# Patient Record
Sex: Female | Born: 1935 | Race: White | Hispanic: No | Smoking: Never smoker
Health system: Southern US, Community
[De-identification: ages and names within clinical notes are randomized; demographics above are authoritative.]

## PROBLEM LIST (undated history)

## (undated) DIAGNOSIS — L719 Rosacea, unspecified: Secondary | ICD-10-CM

## (undated) DIAGNOSIS — J449 Chronic obstructive pulmonary disease, unspecified: Secondary | ICD-10-CM

## (undated) DIAGNOSIS — K589 Irritable bowel syndrome without diarrhea: Secondary | ICD-10-CM

## (undated) DIAGNOSIS — I10 Essential (primary) hypertension: Secondary | ICD-10-CM

## (undated) DIAGNOSIS — K5790 Diverticulosis of intestine, part unspecified, without perforation or abscess without bleeding: Secondary | ICD-10-CM

## (undated) HISTORY — PX: CHOLECYSTECTOMY: SHX55

## (undated) HISTORY — PX: ADENOIDECTOMY: SUR15

## (undated) HISTORY — PX: ORIF ACETABULAR FRACTURE: SHX5029

## (undated) HISTORY — PX: STAPEDECTOMY: SHX2435

## (undated) HISTORY — DX: Essential (primary) hypertension: I10

## (undated) HISTORY — DX: Irritable bowel syndrome without diarrhea: K58.9

## (undated) HISTORY — PX: APPENDECTOMY: SHX54

## (undated) HISTORY — DX: Rosacea, unspecified: L71.9

## (undated) HISTORY — DX: Diverticulosis of intestine, part unspecified, without perforation or abscess without bleeding: K57.90

## (undated) HISTORY — DX: Chronic obstructive pulmonary disease, unspecified: J44.9

## (undated) HISTORY — PX: TONSILLECTOMY: SUR1361

---

## 1998-01-09 ENCOUNTER — Ambulatory Visit (HOSPITAL_COMMUNITY): Admission: RE | Admit: 1998-01-09 | Discharge: 1998-01-09 | Payer: Self-pay

## 1998-06-06 ENCOUNTER — Encounter: Payer: Self-pay | Admitting: *Deleted

## 1998-06-06 ENCOUNTER — Ambulatory Visit (HOSPITAL_COMMUNITY): Admission: RE | Admit: 1998-06-06 | Discharge: 1998-06-06 | Payer: Self-pay | Admitting: *Deleted

## 1998-07-07 ENCOUNTER — Other Ambulatory Visit: Admission: RE | Admit: 1998-07-07 | Discharge: 1998-07-07 | Payer: Self-pay | Admitting: *Deleted

## 1998-07-28 ENCOUNTER — Ambulatory Visit: Admission: RE | Admit: 1998-07-28 | Discharge: 1998-07-28 | Payer: Self-pay

## 1999-02-01 ENCOUNTER — Ambulatory Visit (HOSPITAL_COMMUNITY): Admission: RE | Admit: 1999-02-01 | Discharge: 1999-02-01 | Payer: Self-pay

## 1999-07-27 ENCOUNTER — Other Ambulatory Visit: Admission: RE | Admit: 1999-07-27 | Discharge: 1999-07-27 | Payer: Self-pay | Admitting: *Deleted

## 2000-01-29 ENCOUNTER — Encounter: Payer: Self-pay | Admitting: Gastroenterology

## 2000-01-29 ENCOUNTER — Ambulatory Visit (HOSPITAL_COMMUNITY): Admission: RE | Admit: 2000-01-29 | Discharge: 2000-01-29 | Payer: Self-pay | Admitting: Gastroenterology

## 2000-02-21 ENCOUNTER — Encounter (INDEPENDENT_AMBULATORY_CARE_PROVIDER_SITE_OTHER): Payer: Self-pay

## 2000-02-21 ENCOUNTER — Other Ambulatory Visit: Admission: RE | Admit: 2000-02-21 | Discharge: 2000-02-21 | Payer: Self-pay | Admitting: *Deleted

## 2000-03-14 ENCOUNTER — Ambulatory Visit (HOSPITAL_COMMUNITY): Admission: RE | Admit: 2000-03-14 | Discharge: 2000-03-14 | Payer: Self-pay | Admitting: Gastroenterology

## 2000-03-14 ENCOUNTER — Encounter: Payer: Self-pay | Admitting: Gastroenterology

## 2000-03-16 ENCOUNTER — Ambulatory Visit (HOSPITAL_COMMUNITY): Admission: RE | Admit: 2000-03-16 | Discharge: 2000-03-16 | Payer: Self-pay

## 2000-05-31 ENCOUNTER — Encounter: Admission: RE | Admit: 2000-05-31 | Discharge: 2000-05-31 | Payer: Self-pay | Admitting: *Deleted

## 2000-05-31 ENCOUNTER — Encounter: Payer: Self-pay | Admitting: *Deleted

## 2001-04-11 ENCOUNTER — Encounter: Admission: RE | Admit: 2001-04-11 | Discharge: 2001-04-11 | Payer: Self-pay | Admitting: *Deleted

## 2001-04-11 ENCOUNTER — Encounter: Payer: Self-pay | Admitting: *Deleted

## 2001-04-11 ENCOUNTER — Other Ambulatory Visit: Admission: RE | Admit: 2001-04-11 | Discharge: 2001-04-11 | Payer: Self-pay | Admitting: *Deleted

## 2002-03-04 ENCOUNTER — Encounter: Payer: Self-pay | Admitting: Rheumatology

## 2002-03-04 ENCOUNTER — Ambulatory Visit (HOSPITAL_COMMUNITY): Admission: RE | Admit: 2002-03-04 | Discharge: 2002-03-04 | Payer: Self-pay | Admitting: Rheumatology

## 2002-05-08 ENCOUNTER — Encounter: Admission: RE | Admit: 2002-05-08 | Discharge: 2002-05-08 | Payer: Self-pay | Admitting: *Deleted

## 2002-05-08 ENCOUNTER — Encounter: Payer: Self-pay | Admitting: *Deleted

## 2003-06-11 ENCOUNTER — Encounter: Admission: RE | Admit: 2003-06-11 | Discharge: 2003-06-11 | Payer: Self-pay | Admitting: *Deleted

## 2004-06-16 ENCOUNTER — Encounter: Admission: RE | Admit: 2004-06-16 | Discharge: 2004-06-16 | Payer: Self-pay | Admitting: *Deleted

## 2005-08-18 ENCOUNTER — Encounter: Admission: RE | Admit: 2005-08-18 | Discharge: 2005-08-18 | Payer: Self-pay | Admitting: *Deleted

## 2006-11-15 ENCOUNTER — Encounter: Admission: RE | Admit: 2006-11-15 | Discharge: 2006-11-15 | Payer: Self-pay | Admitting: *Deleted

## 2006-12-04 ENCOUNTER — Encounter: Admission: RE | Admit: 2006-12-04 | Discharge: 2006-12-04 | Payer: Self-pay | Admitting: *Deleted

## 2007-03-08 ENCOUNTER — Ambulatory Visit: Payer: Self-pay | Admitting: Gastroenterology

## 2007-03-27 ENCOUNTER — Ambulatory Visit: Payer: Self-pay | Admitting: Gastroenterology

## 2007-04-10 ENCOUNTER — Ambulatory Visit: Payer: Self-pay | Admitting: Internal Medicine

## 2007-05-21 ENCOUNTER — Ambulatory Visit: Payer: Self-pay | Admitting: Internal Medicine

## 2007-11-19 ENCOUNTER — Ambulatory Visit: Payer: Self-pay | Admitting: Internal Medicine

## 2007-11-19 DIAGNOSIS — J45909 Unspecified asthma, uncomplicated: Secondary | ICD-10-CM | POA: Insufficient documentation

## 2007-11-25 DIAGNOSIS — J449 Chronic obstructive pulmonary disease, unspecified: Secondary | ICD-10-CM | POA: Insufficient documentation

## 2007-11-25 DIAGNOSIS — I10 Essential (primary) hypertension: Secondary | ICD-10-CM | POA: Insufficient documentation

## 2007-12-13 ENCOUNTER — Encounter: Admission: RE | Admit: 2007-12-13 | Discharge: 2007-12-13 | Payer: Self-pay | Admitting: *Deleted

## 2007-12-21 ENCOUNTER — Encounter: Admission: RE | Admit: 2007-12-21 | Discharge: 2007-12-21 | Payer: Self-pay | Admitting: *Deleted

## 2008-12-19 ENCOUNTER — Ambulatory Visit: Payer: Self-pay | Admitting: Internal Medicine

## 2009-02-06 ENCOUNTER — Encounter: Admission: RE | Admit: 2009-02-06 | Discharge: 2009-02-06 | Payer: Self-pay | Admitting: *Deleted

## 2009-07-20 ENCOUNTER — Ambulatory Visit: Payer: Self-pay | Admitting: Internal Medicine

## 2009-08-31 ENCOUNTER — Telehealth (INDEPENDENT_AMBULATORY_CARE_PROVIDER_SITE_OTHER): Payer: Self-pay | Admitting: *Deleted

## 2010-02-12 ENCOUNTER — Encounter: Admission: RE | Admit: 2010-02-12 | Discharge: 2010-02-12 | Payer: Self-pay | Admitting: *Deleted

## 2010-07-19 ENCOUNTER — Ambulatory Visit: Payer: Self-pay | Admitting: Internal Medicine

## 2010-10-24 ENCOUNTER — Encounter: Payer: Self-pay | Admitting: *Deleted

## 2010-10-25 ENCOUNTER — Encounter: Payer: Self-pay | Admitting: *Deleted

## 2010-11-02 NOTE — Assessment & Plan Note (Signed)
Summary: rov 1 yr ///kp   Primary Tisheena Maguire/Referring Mirakle Tomlin:  Eula Listen  CC:  Yearly follow up visit-COPD/asthma; SOB very little only with Increased excerise.Marland Kitchen  History of Present Illness:  11/19/07- Doing great- good previous 6 months.Winter is usually her worst season, but has not had cold or flu this winter. No recent need for her albuterol rescue inhaler. Walking 2 miles, 3x/week. PFT 8/08 showed mild to moderate obstruction with airtrapping  12/19/08 Asthma, copd Had very good winter. Asked about availability of flu vaccine for here next year. Did not have a significant respiratory illness this winter. Needs meds refilled. Using Advair 100 just once daily now. Fexofenadine 180 has worked well as an antihistamine. Denies productive cough, chest pain or palpitation, nausea or vomiting, bleeding or purulent discharge,  July 20, 2009- Asthma, COPD She asks flu vax. We discussed her hx of minor egg allergy, without previous reaction to flu vaccine. She has done well in recent travelling. Denies weight change, fever, cough, chest pain or palpitation.  July 19, 2010- Asthma, COPD Nurse cc: Yearly follow up visit-COPD/asthma; SOB very little- only with Increased excerise. She is in a regular exercise group for seniors at [facility] and is pleased with her status. No major health events in past year. Wants fluvax. Does have hx test positive for egg allergy but she has tolerated vax twice with no problems the last 2 years. Hasn't needed rescue inhaker but wants it updated. Uses Advair once daily. CXR in 2009 showed CE. She is due for an echocardiogram  2 weeks and she asks about updating CXR. Asks about getting her second pneumonia  vaccine next year.    Asthma History    Initial Asthma Severity Rating:    Age range: 12+ years    Symptoms: 0-2 days/week    Nighttime Awakenings: 0-2/month    Interferes w/ normal activity: no limitations    SABA use (not  for EIB): 0-2 days/week    Asthma Severity Assessment: Intermittent   Preventive Screening-Counseling & Management  Alcohol-Tobacco     Smoking Status: never     Passive Smoke Exposure: yes  Current Medications (verified): 1)  Advair Diskus 100-50 Mcg/dose  Misc (Fluticasone-Salmeterol) .Marland Kitchen.. 1 Puff and Rinse, Twice Daily 2)  Fluticasone Propionate 50 Mcg/act Susp (Fluticasone Propionate) .Marland Kitchen.. 1-2 Sprays Each Nostril Daily 3)  Proair Hfa 108 (90 Base) Mcg/act Aers (Albuterol Sulfate) .... 2 Puffs Four Times A Day As Needed Rescue Inhaler 4)  Minocin 50 Mg  Caps (Minocycline Hcl) .... Take One Capsule By Mouth Two Times A Day As Needed 5)  Lisinopril 10 Mg  Tabs (Lisinopril) .... Take 1 Tablet By Mouth Once A Day 6)  Norvasc 10 Mg  Tabs (Amlodipine Besylate) .... Take 1 Tablet By Mouth Once A Day 7)  Metoprolol Succinate 50 Mg  Tb24 (Metoprolol Succinate) .... Take 1 Tablet By Mouth Once A Day 8)  Actonel 150 Mg Tabs (Risedronate Sodium) .... Take 1 Tablet By Mouth Once A Day 9)  Aspirin Adult Low Strength 81 Mg Tbec (Aspirin) .... Take 1 Tablet By Mouth Once A Day  Allergies (verified): 1)  ! Penicillin 2)  ! * Flu Vaccination 3)  ! * Eggs  Past History:  Past Medical History: Last updated: 11/19/2007 Asthma C O P D diverticulosis irritable bowel Hypertension rosacea  Past Surgical History: Last updated: 11/19/2007 ORIF right leg fx stapedectomy Appendectomy Cholecystectomy  Family History: Last updated: 11/19/2007 Allergy asthma arthritis cancer  Social History: Last updated:  11/19/2007 Patient never smoked.  Positive history of passive tobacco smoke exposure. widowed twice   Risk Factors: Smoking Status: never (07/19/2010) Passive Smoke Exposure: yes (07/19/2010)  Review of Systems      See HPI  The patient denies shortness of breath with activity, shortness of breath at rest, productive cough, non-productive cough, coughing up blood, chest pain,  irregular heartbeats, acid heartburn, indigestion, loss of appetite, weight change, abdominal pain, difficulty swallowing, sore throat, tooth/dental problems, headaches, nasal congestion/difficulty breathing through nose, sneezing, ear ache, and rash.    Vital Signs:  Patient profile:   77 year old female Height:      65 inches Weight:      129.25 pounds BMI:     21.59 O2 Sat:      99 % on Room air Pulse rate:   71 / minute BP sitting:   114 / 70  (left arm) Cuff size:   regular  Vitals Entered By: Reynaldo Minium CMA (July 19, 2010 1:57 PM)  O2 Flow:  Room air CC: Yearly follow up visit-COPD/asthma; SOB very little only with Increased excerise.   Physical Exam  Additional Exam:  General: A/Ox3; pleasant and cooperative, NAD, slender, alert elderly lady, very pleasant SKIN: no rash, lesions NODES: no lymphadenopathy HEENT: [place]/AT, EOM- WNL, Conjuctivae- clear, PERRLA, TM-WNL, Nose- clear, Throat- clear and wnl, Mallampati II, own teeth NECK: Supple w/ fair ROM, JVD- mild, normal carotid impulses w/o bruits Thyroid- normal to palpation CHEST: Clear to P&A- no wheeze or rhonchi, unlabored HEART: RRR, no m/g/r heard ABDOMEN: medium build ZOX:WRUE, nl pulses, no edema , good capillary refill NEURO: Grossly intact to observation      Impression & Recommendations:  Problem # 1:  ASTHMA (ICD-493.90) Minimal reactive airway symptoms. We are refiling meds including her rescue inhaler that she wants to keep available.   Problem # 2:  C O P D (ICD-496) We will update CXR. Her continued exercise is a great asset.Flu vaccine discussion and genral infection avoidance strategies.  Other Orders: Est. Patient Level III (45409) T-2 View CXR (71020TC) Influenza Vaccine MCR (81191)  Patient Instructions: 1)  Please schedule a follow-up appointment in 1 year. 2)  I am really glad you are doing so well. Please call if you need me.  3)  Meds refilled 4)  A chest x-ray has been  recommended.  Your imaging study may require preauthorization.  Prescriptions: PROAIR HFA 108 (90 BASE) MCG/ACT AERS (ALBUTEROL SULFATE) 2 puffs four times a day as needed rescue inhaler  #1 x prn   Entered and Authorized by:   Waymon Budge MD   Signed by:   Waymon Budge MD on 07/19/2010   Method used:   Print then Give to Patient   RxID:   [id number] FLUTICASONE PROPIONATE 50 MCG/ACT SUSP (FLUTICASONE PROPIONATE) 1-2 sprays each nostril daily  #3 x 3   Entered and Authorized by:   Waymon Budge MD   Signed by:   Waymon Budge MD on 07/19/2010   Method used:   Print then Give to Patient   RxID:   [id number] ADVAIR DISKUS 100-50 MCG/DOSE  MISC (FLUTICASONE-SALMETEROL) 1 puff and rinse, twice daily  #3 x 3   Entered and Authorized by:   Waymon Budge MD   Signed by:   Waymon Budge MD on 07/19/2010   Method used:   Print then Give to Patient   RxID:   [id number]    Immunizations Administered:  Influenza  Vaccine # 1:    Vaccine Type: Fluvax MCR    Site: left deltoid    Mfr: GlaxoSmithKline    Dose: 0.5 ml    Route: IM    Given by: Kandice Hams CMA    Exp. Date: 04/02/2011    Lot #: [id number]    VIS given: 04/27/10 version given July 19, 2010.  Flu Vaccine Consent Questions:    Do you have a history of severe allergic reactions to this vaccine? no    Any prior history of allergic reactions to egg and/or gelatin? no    Do you have a sensitivity to the preservative Thimersol? no    Do you have a past history of Guillan-Barre Syndrome? no    Do you currently have an acute febrile illness? no    Have you ever had a severe reaction to latex? no    Vaccine information given and explained to patient? yes    Are you currently pregnant? no

## 2011-02-15 NOTE — Assessment & Plan Note (Signed)
[facility]                             PULMONARY OFFICE NOTE   [name], [name]                 MRN:          [id number]  DATE:05/21/2007                            DOB:          10-29-1928    PROBLEM:  Obstructive airways disease with bronchitis.   HISTORY:  Fairly quiet Summer, complains she can not fight off colds.  Have noticed that egg white makes her mouth itch, lips swell and also  causes a contact dermatitis so she avoids flu vaccine.  She is walking  about 2 miles 3 times a week on level ground and expects trouble in  cold and flu season as a routine.  She has had pneumococcal vaccine once  a number of years ago.   MEDICATIONS:  1. Astelin.  2. Fexofenadine 180 mg.  3. Minocin 50 mg b.i.d.  4. Advair 250/50.  5. Lisinopril 10 mg.  6. Norvasc 10 mg.  7. Metoprolol 50 mg.  8. She has an albuterol inhaler.   DRUG INTOLERANT PENICILLIN, EGG.   OBJECTIVE:  Weight 128 pounds, blood pressure 146/84, pulse 73, room air  saturation 98%.  Currently she looks quite comfortable and stable.  EYES, NOSE AND THROAT:  Clear.  CHEST:  Clear, breathing unlabored.  HEART:  Sounds regular without murmur.  There is no edema.   Chest x-ray report from [facility] on November 21, 2006  describes cardiomegaly and COPD, stable with no acute process compared  with March 23, 2006.  Our pulmonary function testing from May 21, 2007  showed mild obstructive airways disease with response to bronchodilator.  There was evidence of air trapping with increased residual volume,  diffusion capacity was mildly reduced.  Her FEV1 after bronchodilator  was 1.63 liters (82% predicted) with FEV1/FVC 0.72.  Significant  response to bronchodilator was limited to small airway flows which  remained reduced.  Diffusion was 66% of predicted.  On a 6 minute walk  test she went 441 meters in 6 minutes, completing the test without  stopping.  Heart  rate rose appropriately from 73 to 86 and after 2  minutes rest was 70.  Oxygen saturation remained stable through the test  at 98% - 99% and two minutes later was 99% indicating normal  cardiopulmonary response to this exercise.   IMPRESSION:  1. Cough with asthma and a relatively mild chronic obstructive      component.  Triggers would seem to include nonspecific irritants      including second hand smoke as well as some seasonal allergens and      especially viral triggers.  2. ALLERGIC TO EGG WHITE.   PLAN:  1. Try sample Spiriva.  2. She may want to try nasal flu vaccine or depend on Tamiflu this      fall.  3. Schedule return 6 months, earlier p.r.n.     Clinton D. Maple Hudson, MD, Tonny Bollman, FACP  Electronically Signed    CDY/MedQ  DD: 06/04/2007  DT: 06/04/2007  Job #: [id number]   cc:   Kirt Boys, MD

## 2011-02-15 NOTE — Assessment & Plan Note (Signed)
[facility]                             PULMONARY OFFICE NOTE   MUSETTE, [name]                 MRN:          [id number]  DATE:04/10/2007                            DOB:          May 28, 1929    PROBLEM LIST:  A 77 year old woman referred through the courtesy of Dr.  Montez Morita in [place] because of COPD.   HISTORY:  She says asthma was bad as a child when she had recurrent  bronchitis and pneumonia.  But, she has done much better as an adult.  In January 2008, she visited [place] and [place], where she was exposed to  a lot of tobacco smoke and boat exhaust.  She became congested with low-  grade fever.  On return she was treated with antibiotics, cough meds,  and a chest x-ray showed COPD.  This is the first time she had been told  the diagnosis.  She was given an injected antibiotic and a sample  metered inhaler.  She has begun using Advair and has rarely used  albuterol.  Now not much cough except around cigarette smoke, car  exhaust, and days when air quality is reported poor.   MEDICATIONS:  1. Astelin.  2. Fexofenadine 180 mg.  3. Minocin 50 mg b.i.d.  4. Advair 250/50.  5. Lisinopril 10 mg.  6. Norvasc 10 mg.  7. Metoprolol 50 mg.  8. Albuterol rescue inhaler.   DRUG INTOLERANT:  PENICILLIN.   REVIEW OF SYSTEMS:  Short of breath with activity, productive and  nonproductive cough, chest pains, nasal congestion, and sneezing.  She  denies any reflux or indigestion symptoms.  Grandchildren tell her she  snores.  She sleeps on 1 pillow.  Breathing does not wake her at night.  She walks 3 times a week for a mile and a half or 2 miles.  No ankle  edema.  No routine phlegm.  No blood, adenopathy, or fever.   PAST HISTORY:  1. Hypertension.  2. Asthma.  3. Allergic rhinitis with seasonal exacerbations and frequent head      congestion.  4. Previous pneumonia.  Has had pneumococcal vaccine once.  She avoids      flu vaccine because  of a history of egg allergy.  5. Chest pain workup was negative a year ago.  No exposure to      tuberculosis.  6. Treated for rosacea.   SURGERY:  1. Gallbladder.  2. Appendix.  3. Stapedectomy.  4. She has had ORIF of a right leg fracture.   SOCIAL HISTORY:  Never smoked.  She has been married twice and widowed.  Grown children.  Lives alone.  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   FAMILY HISTORY:  Allergies, asthma, rheumatism, cancer.   OBJECTIVE:  VITAL SIGNS:  Weight  131 pounds, BP 130/84, pulse 71, room  air saturation 99%.  GENERAL:  This is a trim, alert woman in no evident distress, rather  talkative.  HEENT:  Nose and throat are clear.  No neck vein distention or stridor.  I do not find adenopathy.  CHEST:  Quiet without  rales, rhonchi, or wheeze.  She does not cough  while here.  HEART:  Sounds are regular without murmur.  EXTREMITIES:  Without cyanosis, clubbing, or edema.   Chest x-ray:  She brings a film from November 21, 2006.  There is some  peribronchial cuffing at the right hilum which would be consistent with  bronchitis.   IMPRESSION:  Resolving bronchitis, uncertain component of fixed  obstructive airway disease after a prior history of asthma.   PLAN:  1. We are scheduling pulmonary function tests.  2. She will continue Advair 250/50.  3. Consider a followup pneumococcal vaccine since it has been probably      more than 5 years.  4. Schedule return in 1 month.  I appreciate the chance to meet her      and would be happy to discuss her care.     Clinton D. Maple Hudson, MD, Tonny Bollman, FACP  Electronically Signed    CDY/MedQ  DD: 04/28/2007  DT: 04/29/2007  Job #: [id number]   cc:   Kirt Boys, Dr.

## 2011-02-22 ENCOUNTER — Other Ambulatory Visit: Payer: Self-pay | Admitting: *Deleted

## 2011-02-22 DIAGNOSIS — Z1231 Encounter for screening mammogram for malignant neoplasm of breast: Secondary | ICD-10-CM

## 2011-03-04 ENCOUNTER — Ambulatory Visit
Admission: RE | Admit: 2011-03-04 | Discharge: 2011-03-04 | Disposition: A | Discharge status: Home / Self Care | Payer: Medicare Other | Source: Ambulatory Visit | Attending: *Deleted | Admitting: *Deleted

## 2011-03-04 ENCOUNTER — Ambulatory Visit: Payer: Self-pay

## 2011-03-04 DIAGNOSIS — Z1231 Encounter for screening mammogram for malignant neoplasm of breast: Secondary | ICD-10-CM

## 2011-03-07 ENCOUNTER — Other Ambulatory Visit: Payer: Self-pay | Admitting: *Deleted

## 2011-03-07 DIAGNOSIS — Z1231 Encounter for screening mammogram for malignant neoplasm of breast: Secondary | ICD-10-CM

## 2011-04-25 ENCOUNTER — Telehealth: Payer: Self-pay | Admitting: Internal Medicine

## 2011-04-25 MED ORDER — CEFDINIR 300 MG PO CAPS: 300.0000 mg | ORAL_CAPSULE | Freq: Two times a day (BID) | ORAL | Status: AC

## 2011-04-25 MED ORDER — PREDNISONE 10 MG PO TABS: ORAL_TABLET | ORAL | Status: DC

## 2011-04-25 NOTE — Telephone Encounter (Signed)
Offer cefdinir 300 mg, # 14, 2 daily, no refill           Prednisone 10 mg, # 20, 4 X 2 DAYS, 3 X 2 DAYS, 2 X 2 DAYS, 1 X 2 DAYS

## 2011-04-25 NOTE — Telephone Encounter (Signed)
Spoke with pt and notified of recs per CDY. Pt verbalized understanding. Rxs were sent to [facility]. I advised that she contact us if symptoms persist or worsen, or seek emergency care if needed. Pt verbalized understanding.

## 2011-04-25 NOTE — Telephone Encounter (Signed)
I spoke with the pt and she is c/o chest congestion, productive cough with yellow phlegm, head congestion, increased SOB all x 2 weeks. She denies chest tightness, fever or wheezing. She states she has been taking benadryl and robitussin without relief. Please advise. Carron Curie, CMA Allergies  Allergen Reactions  . Eggs Or Egg-Derived Products   . Penicillins    [facility] [place]

## 2011-07-18 ENCOUNTER — Encounter: Payer: Self-pay | Admitting: Internal Medicine

## 2011-07-18 ENCOUNTER — Ambulatory Visit (INDEPENDENT_AMBULATORY_CARE_PROVIDER_SITE_OTHER): Payer: Medicare Other | Admitting: Internal Medicine

## 2011-07-18 VITALS — BP 120/82 | HR 67 | Ht 65.0 in | Wt 128.4 lb

## 2011-07-18 DIAGNOSIS — J310 Chronic rhinitis: Secondary | ICD-10-CM

## 2011-07-18 DIAGNOSIS — J449 Chronic obstructive pulmonary disease, unspecified: Secondary | ICD-10-CM

## 2011-07-18 DIAGNOSIS — Z23 Encounter for immunization: Secondary | ICD-10-CM

## 2011-07-18 MED ORDER — FLUTICASONE PROPIONATE 50 MCG/ACT NA SUSP: 2.0000 | Freq: Every day | NASAL | Status: DC

## 2011-07-18 MED ORDER — CEFDINIR 300 MG PO CAPS: 300.0000 mg | ORAL_CAPSULE | Freq: Two times a day (BID) | ORAL | Status: AC

## 2011-07-18 NOTE — Progress Notes (Signed)
07/18/11- 77 year old female never smoker followed for asthma/COPD, rhinitis, complicated by HBP Last here- 07/19/2010 6 weeks ago she saw her primary physician in [place] bronchitis. Treated with a Z-Pak which did not help. We called in prednisone and Cefdinir, which did much better. She was clear in 2 or 3 days and feels well now. She had questions about whooping cough vaccine and wants a flu shot. She had had recent tetanus vaccine. She is trying to exercise at her local hospital rehabilitation program.  She says she is not coughing routinely.  ROS See HPI Constitutional:   No-   weight loss, night sweats, fevers, chills, fatigue, lassitude. HEENT:   No-  headaches, difficulty swallowing, tooth/dental problems, sore throat,       No-  sneezing, itching, ear ache, nasal congestion, post nasal drip,  CV:  No-   chest pain, orthopnea, PND, swelling in lower extremities, anasarca, dizziness, palpitations Resp: Occasional shortness of breath with exertion or at rest.              No-   productive cough,  No non-productive cough,  No- coughing up of blood.              No-   change in color of mucus.  No- wheezing.   Skin: No-   rash or lesions. GI:  No-   heartburn, indigestion, abdominal pain, nausea, vomiting, diarrhea,                 change in bowel habits, loss of appetite GU: No-   dysuria, change in color of urine, no urgency or frequency.  No- flank pain. MS:  No-   joint pain or swelling.  No- decreased range of motion.  No- back pain. Neuro-     nothing unusual Psych:  No- change in mood or affect. No depression or anxiety.  No memory loss.  OBJ General- Alert, Oriented, Affect-appropriate, Distress- none acute; talkative Skin- rash-none, lesions- none, excoriation- none Lymphadenopathy- none Head- atraumatic            Eyes- Gross vision intact, PERRLA, conjunctivae clear secretions            Ears- Hearing, canals-normal            Nose- Clear, no-Septal dev, mucus, polyps,  erosion, perforation             Throat- Mallampati II , mucosa clear , drainage- none, tonsils- atrophic Neck- flexible , trachea midline, no stridor , thyroid nl, carotid no bruit Chest - symmetrical excursion , unlabored           Heart/CV- RRR , no murmur , no gallop  , no rub, nl s1 s2                           - JVD- none , edema- none, stasis changes- none, varices- none           Lung- clear to P&A, wheeze- none, cough- none , dullness-none, rub- none           Chest wall-  Abd- tender-no, distended-no, bowel sounds-present, HSM- no Br/ Gen/ Rectal- Not done, not indicated Extrem- cyanosis- none, clubbing, none, atrophy- none, strength- nl Neuro- grossly intact to observation

## 2011-07-18 NOTE — Patient Instructions (Signed)
Flu vax  Script for flonase nasal spray  Script for antibiotic to carry on your trip

## 2011-07-20 NOTE — Assessment & Plan Note (Signed)
I've encouraged endurance training the most effective way to maintain her exercise tolerance for activities of daily living. Flu vaccine

## 2012-02-01 ENCOUNTER — Other Ambulatory Visit: Payer: Self-pay | Admitting: Internal Medicine

## 2012-04-16 ENCOUNTER — Other Ambulatory Visit: Payer: Self-pay | Admitting: *Deleted

## 2012-04-16 DIAGNOSIS — Z1231 Encounter for screening mammogram for malignant neoplasm of breast: Secondary | ICD-10-CM

## 2012-04-18 ENCOUNTER — Ambulatory Visit
Admission: RE | Admit: 2012-04-18 | Discharge: 2012-04-18 | Disposition: A | Discharge status: Home / Self Care | Payer: Medicare Other | Source: Ambulatory Visit | Attending: *Deleted | Admitting: *Deleted

## 2012-04-18 DIAGNOSIS — Z1231 Encounter for screening mammogram for malignant neoplasm of breast: Secondary | ICD-10-CM

## 2012-07-17 ENCOUNTER — Encounter: Payer: Self-pay | Admitting: Internal Medicine

## 2012-07-17 ENCOUNTER — Ambulatory Visit (INDEPENDENT_AMBULATORY_CARE_PROVIDER_SITE_OTHER): Payer: Medicare Other | Admitting: Internal Medicine

## 2012-07-17 VITALS — BP 120/74 | HR 77 | Ht 65.0 in | Wt 128.6 lb

## 2012-07-17 DIAGNOSIS — J302 Other seasonal allergic rhinitis: Secondary | ICD-10-CM

## 2012-07-17 DIAGNOSIS — Z23 Encounter for immunization: Secondary | ICD-10-CM

## 2012-07-17 DIAGNOSIS — J449 Chronic obstructive pulmonary disease, unspecified: Secondary | ICD-10-CM

## 2012-07-17 DIAGNOSIS — J309 Allergic rhinitis, unspecified: Secondary | ICD-10-CM

## 2012-07-17 NOTE — Progress Notes (Signed)
07/18/11- 78 year old female never smoker followed for asthma/COPD, rhinitis, complicated by HBP Last here- 07/19/2010 6 weeks ago she saw her primary physician in [place] bronchitis. Treated with a Z-Pak which did not help. We called in prednisone and Cefdinir, which did much better. She was clear in 2 or 3 days and feels well now. She had questions about whooping cough vaccine and wants a flu shot. She had had recent tetanus vaccine. She is trying to exercise at her local hospital rehabilitation program.  She says she is not coughing routinely.  07/17/12- 78 year old female never smoker followed for asthma/COPD, rhinitis, complicated by HBP Watery rhinorrhea bothers her seasonally. Uses Benadryl but it makes her sleepy. Using Flonase. Minimal cough. COPD assessment test (CAT) 9/40 a1AT MZ- I got into a lengthy explanation with her about genetics and the medical significance. As a heterozygote she is not at increased risk under routine circumstances.  ROS-See HPI Constitutional:   No-   weight loss, night sweats, fevers, chills, fatigue, lassitude. HEENT:   No-  headaches, difficulty swallowing, tooth/dental problems, sore throat,       No-  sneezing, itching, ear ache, nasal congestion, post nasal drip,  CV:  No-   chest pain, orthopnea, PND, swelling in lower extremities, anasarca, dizziness, palpitations Resp: Occasional shortness of breath with exertion or at rest.              No-   productive cough,  No non-productive cough,  No- coughing up of blood.              No-   change in color of mucus.  No- wheezing.   Skin: No-   rash or lesions. GI:  No-   heartburn, indigestion, abdominal pain, nausea, vomiting,  GU: . MS:  No-   joint pain or swelling.   Neuro-     nothing unusual Psych:  No- change in mood or affect. No depression or anxiety.  No memory loss.  OBJ General- Alert, Oriented, Affect-appropriate, Distress- none acute; talkative Skin- rash-none, lesions- none,  excoriation- none Lymphadenopathy- none Head- atraumatic            Eyes- Gross vision intact, PERRLA, conjunctivae clear secretions            Ears- Hearing, canals-normal            Nose- Clear, no-Septal dev, mucus, polyps, erosion, perforation             Throat- Mallampati II , mucosa clear , drainage- none, tonsils- atrophic Neck- flexible , trachea midline, no stridor , thyroid nl, carotid no bruit Chest - symmetrical excursion , unlabored           Heart/CV- RRR , no murmur , no gallop  , no rub, nl s1 s2                           - JVD- none , edema- none, stasis changes- none, varices- none           Lung- clear to P&A, wheeze- none, cough- none , dullness-none, rub- none           Chest wall-  Abd-  Br/ Gen/ Rectal- Not done, not indicated Extrem- cyanosis- none, clubbing, none, atrophy- none, strength- nl Neuro- grossly intact to observation

## 2012-07-17 NOTE — Patient Instructions (Addendum)
I suggest you send a copy of your alpha-1-antitrypsin test to each of your children. 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 If any of your family is found by their doctor to have two abnormal genes for this condition, they would be more at risk for lung or liver disease.  Sample Dymista nasal spray-   1 or 2 puffs each nostril, once every day at bedtime  Flu vax

## 2012-07-26 DIAGNOSIS — J3089 Other allergic rhinitis: Secondary | ICD-10-CM | POA: Insufficient documentation

## 2012-07-26 NOTE — Assessment & Plan Note (Signed)
Although she describes it  seasonal, at her age, watery rhinorrhea may be a vasomotor rhinitis. Plan-sample Dymista

## 2012-07-26 NOTE — Assessment & Plan Note (Signed)
Plan-flu shot 

## 2012-09-18 ENCOUNTER — Other Ambulatory Visit: Payer: Self-pay | Admitting: Internal Medicine

## 2012-09-18 NOTE — Telephone Encounter (Signed)
Pt has not received this medication since 2011. Was last seen in 07/2012. Okay to refill?

## 2012-09-19 MED ORDER — ALBUTEROL SULFATE HFA 108 (90 BASE) MCG/ACT IN AERS: 2.0000 | INHALATION_SPRAY | Freq: Four times a day (QID) | RESPIRATORY_TRACT | Status: DC | PRN

## 2012-09-19 NOTE — Telephone Encounter (Signed)
rx called in

## 2012-09-19 NOTE — Telephone Encounter (Signed)
Ok to refill HFA proair # 1, 2 puffs every 4 hours if needed, ref prn

## 2012-10-25 ENCOUNTER — Other Ambulatory Visit: Payer: Self-pay | Admitting: Internal Medicine

## 2013-05-21 ENCOUNTER — Other Ambulatory Visit: Payer: Self-pay

## 2013-05-21 DIAGNOSIS — Z1231 Encounter for screening mammogram for malignant neoplasm of breast: Secondary | ICD-10-CM

## 2013-05-31 ENCOUNTER — Ambulatory Visit: Payer: Medicare Other

## 2013-06-10 ENCOUNTER — Ambulatory Visit: Payer: Medicare Other

## 2013-06-10 ENCOUNTER — Ambulatory Visit
Admission: RE | Admit: 2013-06-10 | Discharge: 2013-06-10 | Disposition: A | Discharge status: Home / Self Care | Payer: Medicare Other | Source: Ambulatory Visit

## 2013-06-10 DIAGNOSIS — Z1231 Encounter for screening mammogram for malignant neoplasm of breast: Secondary | ICD-10-CM

## 2013-07-10 ENCOUNTER — Ambulatory Visit (INDEPENDENT_AMBULATORY_CARE_PROVIDER_SITE_OTHER)
Admission: RE | Admit: 2013-07-10 | Discharge: 2013-07-10 | Disposition: A | Discharge status: Home / Self Care | Payer: Medicare Other | Source: Ambulatory Visit | Attending: Internal Medicine | Admitting: Internal Medicine

## 2013-07-10 ENCOUNTER — Ambulatory Visit (INDEPENDENT_AMBULATORY_CARE_PROVIDER_SITE_OTHER): Payer: Medicare Other | Admitting: Internal Medicine

## 2013-07-10 ENCOUNTER — Encounter: Payer: Self-pay | Admitting: Internal Medicine

## 2013-07-10 VITALS — BP 150/80 | HR 75 | Ht 65.0 in | Wt 128.0 lb

## 2013-07-10 DIAGNOSIS — J45901 Unspecified asthma with (acute) exacerbation: Secondary | ICD-10-CM

## 2013-07-10 DIAGNOSIS — IMO0001 Reserved for inherently not codable concepts without codable children: Secondary | ICD-10-CM

## 2013-07-10 DIAGNOSIS — L719 Rosacea, unspecified: Secondary | ICD-10-CM

## 2013-07-10 DIAGNOSIS — M7918 Myalgia, other site: Secondary | ICD-10-CM

## 2013-07-10 DIAGNOSIS — J310 Chronic rhinitis: Secondary | ICD-10-CM

## 2013-07-10 DIAGNOSIS — H10829 Rosacea conjunctivitis, unspecified eye: Secondary | ICD-10-CM

## 2013-07-10 DIAGNOSIS — Z23 Encounter for immunization: Secondary | ICD-10-CM

## 2013-07-10 DIAGNOSIS — J449 Chronic obstructive pulmonary disease, unspecified: Secondary | ICD-10-CM

## 2013-07-10 NOTE — Patient Instructions (Signed)
Order- CXR      Dx asthma  Flu vax  Ok to try Tylenol PM for the back pain so you can sleep  Please call as needed

## 2013-07-10 NOTE — Progress Notes (Signed)
07/18/11- 79 year old female never smoker followed for asthma/COPD, rhinitis, complicated by HBP Last here- 07/19/2010 6 weeks ago she saw her primary physician in [place] bronchitis. Treated with a Z-Pak which did not help. We called in prednisone and Cefdinir, which did much better. She was clear in 2 or 3 days and feels well now. She had questions about whooping cough vaccine and wants a flu shot. She had had recent tetanus vaccine. She is trying to exercise at her local hospital rehabilitation program.  She says she is not coughing routinely.  07/17/12- 79 year old female never smoker followed for asthma/COPD, rhinitis, complicated by HBP Watery rhinorrhea bothers her seasonally. Uses Benadryl but it makes her sleepy. Using Flonase. Minimal cough. COPD assessment test (CAT) 9/40 a1AT MZ- I got into a lengthy explanation with her about genetics and the medical significance. As a heterozygote she is not at increased risk under routine circumstances.  07/10/13- 79 year old female never smoker followed for asthma/COPD, rhinitis, complicated by HBP FOLLOWS FOR:  Breathing is unchanged since last OV.  Discuss medication she could take for red eyes Rosacea around the eyes and makes her eyes burn. Dermatologist gave her minocycline. Complains of aching around left scapula-muscle pain per her rheumatologist. Gets steroid injection 2 or 3 times per year.  No cough except around strong odors.  ROS-See HPI Constitutional:   No-   weight loss, night sweats, fevers, chills, fatigue, lassitude. HEENT:   No-  headaches, difficulty swallowing, tooth/dental problems, sore throat,       No-  sneezing, itching, ear ache, nasal congestion, post nasal drip,  CV:  No-   chest pain, orthopnea, PND, swelling in lower extremities, anasarca, dizziness, palpitations Resp: Occasional shortness of breath with exertion or at rest.              No-   productive cough,  + non-productive cough,  No- coughing up of blood.               No-   change in color of mucus.  No- wheezing.   Skin: No-   rash or lesions. GI:  No-   heartburn, indigestion, abdominal pain, nausea, vomiting,  GU: . MS:  No-   joint pain or swelling.   Neuro-     nothing unusual Psych:  No- change in mood or affect. No depression or anxiety.  No memory loss.  OBJ General- Alert, Oriented, Affect-appropriate, Distress- none acute; talkative Skin- rash-none, lesions- none, excoriation- none Lymphadenopathy- none Head- atraumatic            Eyes- Gross vision intact, PERRLA, conjunctivae clear secretions            Ears- Hearing, canals-normal            Nose- Clear, no-Septal dev, mucus, polyps, erosion, perforation             Throat- Mallampati II , mucosa clear , drainage- none, tonsils- atrophic Neck- flexible , trachea midline, no stridor , thyroid nl, carotid no bruit Chest - symmetrical excursion , unlabored           Heart/CV- RRR , no murmur , no gallop  , no rub, nl s1 s2                           - JVD- none , edema- none, stasis changes- none, varices- none           Lung- clear to P&A, wheeze- none,  cough- none , dullness-none, rub- none           Chest wall- tense muscle left back Abd-  Br/ Gen/ Rectal- Not done, not indicated Extrem- cyanosis- none, clubbing, none, atrophy- none, strength- nl Neuro- grossly intact to observation

## 2013-07-11 NOTE — Progress Notes (Signed)
Quick Note:  Spoke with pt. Informed her of cxr results per Dr. Maple Hudson. She verbalized understanding and voiced no further questions or concerns at this time. ______

## 2013-07-18 ENCOUNTER — Ambulatory Visit: Payer: Medicare Other | Admitting: Internal Medicine

## 2013-07-25 DIAGNOSIS — H10829 Rosacea conjunctivitis, unspecified eye: Secondary | ICD-10-CM | POA: Insufficient documentation

## 2013-07-25 DIAGNOSIS — M7918 Myalgia, other site: Secondary | ICD-10-CM | POA: Insufficient documentation

## 2013-07-25 NOTE — Assessment & Plan Note (Signed)
Describing vasomotor rhinitis pattern of sensitivity to strong odors

## 2013-07-25 NOTE — Assessment & Plan Note (Signed)
Diagnosed by dermatologist. Plan-follow dermatology recommendations

## 2013-07-25 NOTE — Assessment & Plan Note (Signed)
Plan-flu shot, chest x-ray

## 2013-07-25 NOTE — Assessment & Plan Note (Signed)
Plan-local heat for back, Tylenol PM

## 2013-09-16 ENCOUNTER — Other Ambulatory Visit: Payer: Self-pay | Admitting: Internal Medicine

## 2013-12-27 ENCOUNTER — Other Ambulatory Visit: Payer: Self-pay | Admitting: Internal Medicine

## 2014-07-10 ENCOUNTER — Ambulatory Visit: Payer: Medicare Other | Admitting: Internal Medicine

## 2014-07-10 ENCOUNTER — Encounter: Payer: Self-pay | Admitting: Internal Medicine

## 2014-07-10 VITALS — BP 128/86 | HR 73 | Ht 66.0 in | Wt 134.8 lb

## 2014-07-10 DIAGNOSIS — Z23 Encounter for immunization: Secondary | ICD-10-CM

## 2014-07-10 MED ORDER — ALBUTEROL SULFATE HFA 108 (90 BASE) MCG/ACT IN AERS: 2.0000 | INHALATION_SPRAY | Freq: Four times a day (QID) | RESPIRATORY_TRACT | Status: AC | PRN

## 2014-07-10 NOTE — Patient Instructions (Addendum)
Flu vax  Ok to return in a month for Prevnar 13 pneumonia vaccine  Refill script sent for Proair rescue inhaler  Ask your primary doctor if a diuretic would help with your ankle edema

## 2014-07-10 NOTE — Progress Notes (Signed)
07/18/11- 80 year old female never smoker followed for asthma/COPD, rhinitis, complicated by HBP Last here- 07/19/2010 6 weeks ago she saw her primary physician in [place] bronchitis. Treated with a Z-Pak which did not help. We called in prednisone and Cefdinir, which did much better. She was clear in 2 or 3 days and feels well now. She had questions about whooping cough vaccine and wants a flu shot. She had had recent tetanus vaccine. She is trying to exercise at her local hospital rehabilitation program.  She says she is not coughing routinely.  07/17/12- 80 year old female never smoker followed for asthma/COPD, rhinitis, complicated by HBP Watery rhinorrhea bothers her seasonally. Uses Benadryl but it makes her sleepy. Using Flonase. Minimal cough. COPD assessment test (CAT) 9/40 a1AT MZ- I got into a lengthy explanation with her about genetics and the medical significance. As a heterozygote she is not at increased risk under routine circumstances.  07/10/13- 80 year old female never smoker followed for asthma/COPD, rhinitis, complicated by HBP FOLLOWS FOR:  Breathing is unchanged since last OV.  Discuss medication she could take for red eyes Rosacea around the eyes and makes her eyes burn. Dermatologist gave her minocycline. Complains of aching around left scapula-muscle pain per her rheumatologist. Gets steroid injection 2 or 3 times per year.  No cough except around strong odors.  07/10/14- 80 year old female never smoker followed for asthma/COPD, rhinitis, a1ATMZ,  complicated by HBP FOLLOW UP:  COPD; periodically has SOB, DOE, chest tightness, but not too often; does not use oxygen  CXR 07/10/13 IMPRESSION:  COPD changes.  Enlargement of cardiac silhouette.  No acute abnormalities.  Electronically Signed  By: Ulyses Southward M.D.  On: 07/10/2013 12:28   ROS-See HPI Constitutional:   No-   weight loss, night sweats, fevers, chills, fatigue, lassitude. HEENT:   No-  headaches,  difficulty swallowing, tooth/dental problems, sore throat,       No-  sneezing, itching, ear ache, nasal congestion, post nasal drip,  CV:  No-   chest pain, orthopnea, PND, swelling in lower extremities, anasarca, dizziness, palpitations Resp: Occasional shortness of breath with exertion or at rest.              No-   productive cough,  + non-productive cough,  No- coughing up of blood.              No-   change in color of mucus.  No- wheezing.   Skin: No-   rash or lesions. GI:  No-   heartburn, indigestion, abdominal pain, nausea, vomiting,  GU: . MS:  No-   joint pain or swelling.   Neuro-     nothing unusual Psych:  No- change in mood or affect. No depression or anxiety.  No memory loss.  OBJ General- Alert, Oriented, Affect-appropriate, Distress- none acute; talkative Skin- rash-none, lesions- none, excoriation- none Lymphadenopathy- none Head- atraumatic            Eyes- Gross vision intact, PERRLA, conjunctivae clear secretions            Ears- Hearing, canals-normal            Nose- Clear, no-Septal dev, mucus, polyps, erosion, perforation             Throat- Mallampati II , mucosa clear , drainage- none, tonsils- atrophic Neck- flexible , trachea midline, no stridor , thyroid nl, carotid no bruit Chest - symmetrical excursion , unlabored           Heart/CV- RRR , no murmur ,  no gallop  , no rub, nl s1 s2                           - JVD- none , edema- none, stasis changes- none, varices- none           Lung- clear to P&A, wheeze- none, cough- none , dullness-none, rub- none           Chest wall- tense muscle left back Abd-  Br/ Gen/ Rectal- Not done, not indicated Extrem- cyanosis- none, clubbing, none, atrophy- none, strength- nl Neuro- grossly intact to observation

## 2014-07-22 ENCOUNTER — Other Ambulatory Visit: Payer: Self-pay

## 2014-07-22 DIAGNOSIS — Z1231 Encounter for screening mammogram for malignant neoplasm of breast: Secondary | ICD-10-CM

## 2014-07-24 ENCOUNTER — Ambulatory Visit
Admission: RE | Admit: 2014-07-24 | Discharge: 2014-07-24 | Disposition: A | Discharge status: Home / Self Care | Payer: Medicare Other | Source: Ambulatory Visit

## 2014-07-24 DIAGNOSIS — Z1231 Encounter for screening mammogram for malignant neoplasm of breast: Secondary | ICD-10-CM

## 2014-08-11 ENCOUNTER — Ambulatory Visit (INDEPENDENT_AMBULATORY_CARE_PROVIDER_SITE_OTHER): Payer: Self-pay

## 2014-08-11 DIAGNOSIS — Z23 Encounter for immunization: Secondary | ICD-10-CM

## 2014-12-15 ENCOUNTER — Other Ambulatory Visit: Payer: Self-pay | Admitting: Internal Medicine

## 2015-03-30 ENCOUNTER — Other Ambulatory Visit: Payer: Self-pay | Admitting: Internal Medicine

## 2015-05-29 ENCOUNTER — Telehealth: Payer: Self-pay | Admitting: Internal Medicine

## 2015-05-29 NOTE — Telephone Encounter (Signed)
Called spoke with pt. She had to cancel appt 10/10 since [facility] out of office. She reports she always gets her flu shot from [place] in Oct and wants to be worked in to see Dr. Maple Hudson or see someone else in October. Please advise Dr. Maple Hudson thanks

## 2015-06-01 NOTE — Telephone Encounter (Signed)
LMTC x 1  

## 2015-06-01 NOTE — Telephone Encounter (Signed)
[name] - please advise if there are any openings in October that I can use for this patient.

## 2015-06-01 NOTE — Telephone Encounter (Signed)
Please have patient come in on 07-21-15 at 11:15am slot. Thanks.

## 2015-06-02 NOTE — Telephone Encounter (Signed)
Appt made by [facility]. Nothing further needed at this time.

## 2015-06-02 NOTE — Telephone Encounter (Signed)
Pt returned call and I sched her for the appointment for 10/18 @ 11:15 as ok'd per [employer].Caren Griffins'

## 2015-07-01 ENCOUNTER — Encounter: Payer: Self-pay | Admitting: Gastroenterology

## 2015-07-13 ENCOUNTER — Ambulatory Visit: Payer: Medicare Other | Admitting: Internal Medicine

## 2015-07-17 ENCOUNTER — Telehealth: Payer: Self-pay | Admitting: Internal Medicine

## 2015-07-17 NOTE — Telephone Encounter (Signed)
Pt rescheduled for 10/20 at 9:30am.  Pt notified.  appt on 10/18 cancelled. Nothing further needed.

## 2015-07-17 NOTE — Telephone Encounter (Signed)
[facility] is not in office on on Tuesday 07-21-15; pt can be seen on Thursday 07-23-15 at 9:30am slot. Appt for Tuesday has not been cancelled yet. Thanks.

## 2015-07-21 ENCOUNTER — Ambulatory Visit: Payer: Medicare Other | Admitting: Internal Medicine

## 2015-07-23 ENCOUNTER — Ambulatory Visit (INDEPENDENT_AMBULATORY_CARE_PROVIDER_SITE_OTHER)
Admission: RE | Admit: 2015-07-23 | Discharge: 2015-07-23 | Disposition: A | Discharge status: Home / Self Care | Payer: Medicare Other | Source: Ambulatory Visit | Attending: Internal Medicine | Admitting: Internal Medicine

## 2015-07-23 ENCOUNTER — Ambulatory Visit (INDEPENDENT_AMBULATORY_CARE_PROVIDER_SITE_OTHER): Payer: Medicare Other | Admitting: Internal Medicine

## 2015-07-23 ENCOUNTER — Other Ambulatory Visit (INDEPENDENT_AMBULATORY_CARE_PROVIDER_SITE_OTHER): Payer: Medicare Other

## 2015-07-23 ENCOUNTER — Encounter: Payer: Self-pay | Admitting: Internal Medicine

## 2015-07-23 VITALS — BP 112/60 | HR 62 | Ht 66.0 in | Wt 128.4 lb

## 2015-07-23 DIAGNOSIS — J449 Chronic obstructive pulmonary disease, unspecified: Secondary | ICD-10-CM | POA: Diagnosis not present

## 2015-07-23 DIAGNOSIS — J45909 Unspecified asthma, uncomplicated: Secondary | ICD-10-CM

## 2015-07-23 DIAGNOSIS — J439 Emphysema, unspecified: Secondary | ICD-10-CM | POA: Diagnosis not present

## 2015-07-23 DIAGNOSIS — Z23 Encounter for immunization: Secondary | ICD-10-CM

## 2015-07-23 DIAGNOSIS — R590 Localized enlarged lymph nodes: Secondary | ICD-10-CM | POA: Diagnosis not present

## 2015-07-23 LAB — CBC WITH DIFFERENTIAL/PLATELET
Basophils Absolute: 0 10*3/uL (ref 0.0–0.1)
Basophils Relative: 0.4 % (ref 0.0–3.0)
Eosinophils Absolute: 0.3 10*3/uL (ref 0.0–0.7)
Eosinophils Relative: 3.6 % (ref 0.0–5.0)
HCT: 37.5 % (ref 36.0–46.0)
Hemoglobin: 12.6 g/dL (ref 12.0–15.0)
Lymphocytes Relative: 14.6 % (ref 12.0–46.0)
Lymphs Abs: 1.4 10*3/uL (ref 0.7–4.0)
MCHC: 33.5 g/dL (ref 30.0–36.0)
MCV: 88.7 fl (ref 78.0–100.0)
Monocytes Absolute: 0.7 10*3/uL (ref 0.1–1.0)
Monocytes Relative: 7 % (ref 3.0–12.0)
Neutro Abs: 7 10*3/uL (ref 1.4–7.7)
Neutrophils Relative %: 74.4 % (ref 43.0–77.0)
Platelets: 315 10*3/uL (ref 150.0–400.0)
RBC: 4.22 Mil/uL (ref 3.87–5.11)
RDW: 14.1 % (ref 11.5–15.5)
WBC: 9.5 10*3/uL (ref 4.0–10.5)

## 2015-07-23 NOTE — Progress Notes (Signed)
07/18/11- 81 year old female never smoker followed for asthma/COPD, rhinitis, complicated by HBP Last here- 07/19/2010 6 weeks ago she saw her primary physician in [place] bronchitis. Treated with a Z-Pak which did not help. We called in prednisone and Cefdinir, which did much better. She was clear in 2 or 3 days and feels well now. She had questions about whooping cough vaccine and wants a flu shot. She had had recent tetanus vaccine. She is trying to exercise at her local hospital rehabilitation program.  She says she is not coughing routinely.  07/17/12- [age] year old female never smoker followed for asthma/COPD, rhinitis, complicated by HBP Watery rhinorrhea bothers her seasonally. Uses Benadryl but it makes her sleepy. Using Flonase. Minimal cough. COPD assessment test (CAT) 9/40 a1AT MZ- I got into a lengthy explanation with her about genetics and the medical significance. As a heterozygote she is not at increased risk under routine circumstances.  07/10/13- 81 year old female never smoker followed for asthma/COPD, rhinitis, complicated by HBP FOLLOWS FOR:  Breathing is unchanged since last OV.  Discuss medication she could take for red eyes Rosacea around the eyes and makes her eyes burn. Dermatologist gave her minocycline. Complains of aching around left scapula-muscle pain per her rheumatologist. Gets steroid injection 2 or 3 times per year.  No cough except around strong odors.  07/10/14- 81 year old female never smoker followed for asthma/COPD, rhinitis, a1ATMZ,  complicated by HBP FOLLOW UP:  COPD; periodically has SOB, DOE, chest tightness, but not too often; does not use oxygen  CXR 07/10/13 IMPRESSION:  COPD changes.  Enlargement of cardiac silhouette.  No acute abnormalities.  Electronically Signed  By: Ulyses SouthwardMark Boles M.D.  On: 07/10/2013 12:28  07/23/15- 81 year old female never smoker followed for asthma/COPD, rhinitis, a1ATMZ,  complicated by HBP FOLLOWS FOR:SOB staying the  same with exertion, no cough or wheeze,denies cp or tightness More dyspnea on exertion noticed when she is cleaning her home, but nothing very abrupt or specific. Still goes to exercise class III times per week and walks 1 mile on treadmill, but now says it flat instead of with slight elevation. No cough or wheeze. New problem-knot noted left side of neck 1 month, nontender. Office Spirometry 07/23/2015-moderately severe restriction of exhaled volume. FVC 1.53/54%, FEV1 1.12/55%, FEV1/FVC 0.73, FEF 25-75%0.89.  ROS-See HPI Constitutional:   No-   weight loss, night sweats, fevers, chills, fatigue, lassitude. HEENT:   No-  headaches, difficulty swallowing, tooth/dental problems, sore throat,       No-  sneezing, itching, ear ache, nasal congestion, post nasal drip,  CV:  No-   chest pain, orthopnea, PND, swelling in lower extremities, anasarca, dizziness, palpitations Resp: + shortness of breath with exertion or at rest.              No-   productive cough,  + non-productive cough,  No- coughing up of blood.              No-   change in color of mucus.  No- wheezing.   Skin: No-   rash or lesions. GI:  No-   heartburn, indigestion, abdominal pain, nausea, vomiting,  GU: . MS:  No-   joint pain or swelling.   Neuro-     nothing unusual Psych:  No- change in mood or affect. No depression or anxiety.  No memory loss.  OBJ General- Alert, Oriented, Affect-appropriate, Distress- none acute; talkative Skin- rash-none, lesions- none, excoriation- none Lymphadenopathy- + firm left cervical node adjacent to carotid artery Head- atraumatic  Eyes- Gross vision intact, PERRLA, conjunctivae clear secretions            Ears- Hearing, canals-normal            Nose- Clear, no-Septal dev, mucus, polyps, erosion, perforation             Throat- Mallampati II , mucosa clear , drainage- none, tonsils- atrophic Neck- flexible , trachea midline, no stridor , thyroid nl, carotid no bruit Chest -  symmetrical excursion , unlabored           Heart/CV- RRR , no murmur , no gallop  , no rub, nl s1 s2                           - JVD- none , edema- none, stasis changes- none, varices- none           Lung- clear to P&A, wheeze- none, cough- none , dullness-none, rub- none           Chest wall- tense muscle left back Abd-  Br/ Gen/ Rectal- Not done, not indicated Extrem- cyanosis- none, clubbing, none, atrophy- none, strength- nl Neuro- grossly intact to observation

## 2015-07-23 NOTE — Patient Instructions (Signed)
Flu vax  Order- CXR   Dx emphysema unspecified  Order- lab- CBC w diff

## 2015-07-24 DIAGNOSIS — R590 Localized enlarged lymph nodes: Secondary | ICD-10-CM | POA: Insufficient documentation

## 2015-07-24 NOTE — Assessment & Plan Note (Signed)
Mostly emphysema. At age 81, mild gradual increase in dyspnea with exertion may be less significant. Restrictive pattern probably reflects some air trapping, thin body habitus, less well-defined normal values for her age, and weakness. Plan-chest x-ray, CBC with differential

## 2015-07-24 NOTE — Assessment & Plan Note (Signed)
Approximately 2 cm in vertical length, not very mobile. I feel the carotid pulse through it but I think that is transmitted and not an aneurysm. I asked her to show it to her primary physician at upcoming visit. Her PCP will want to consider imaging.

## 2015-12-29 ENCOUNTER — Other Ambulatory Visit: Payer: Self-pay | Admitting: Internal Medicine

## 2016-06-21 IMAGING — DX DG CHEST 2V
2 series · 2 of 2 positions shown · non-contrast
Comparison: 07/20/2013.

CLINICAL DATA: COPD.

EXAM:
CHEST  2 VIEW

[chest pa]
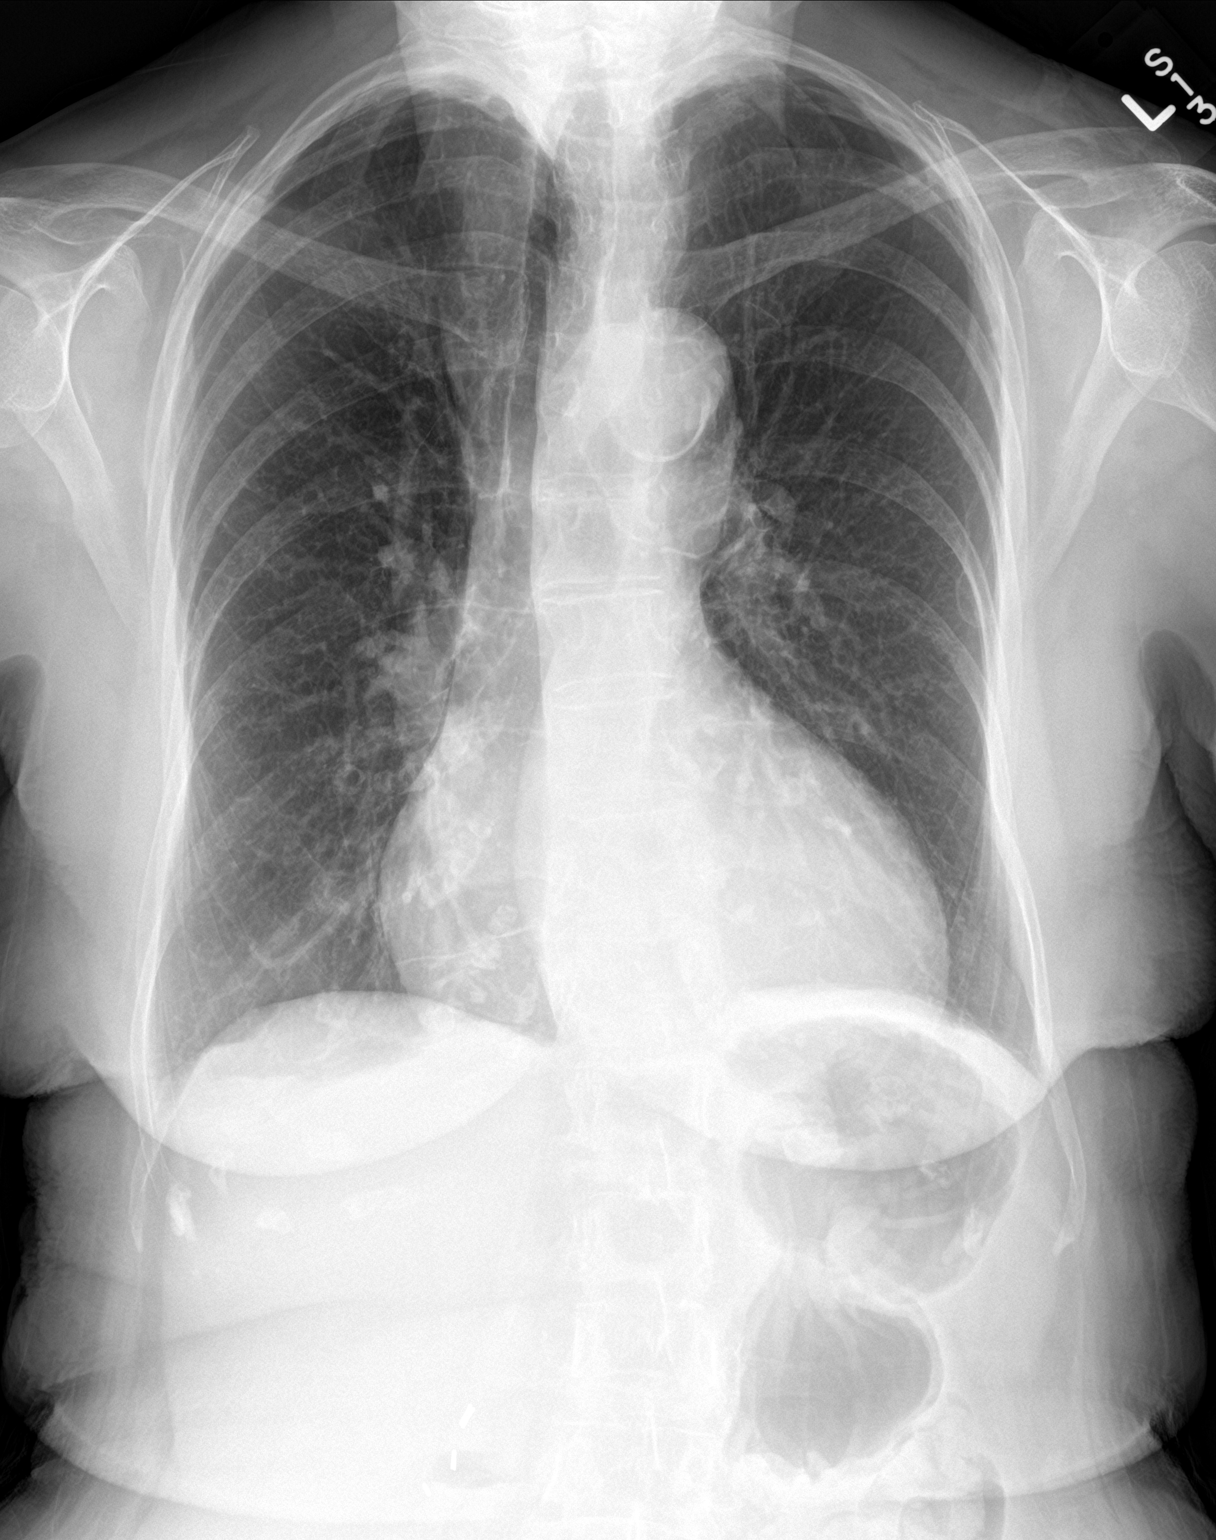

[chest lat]
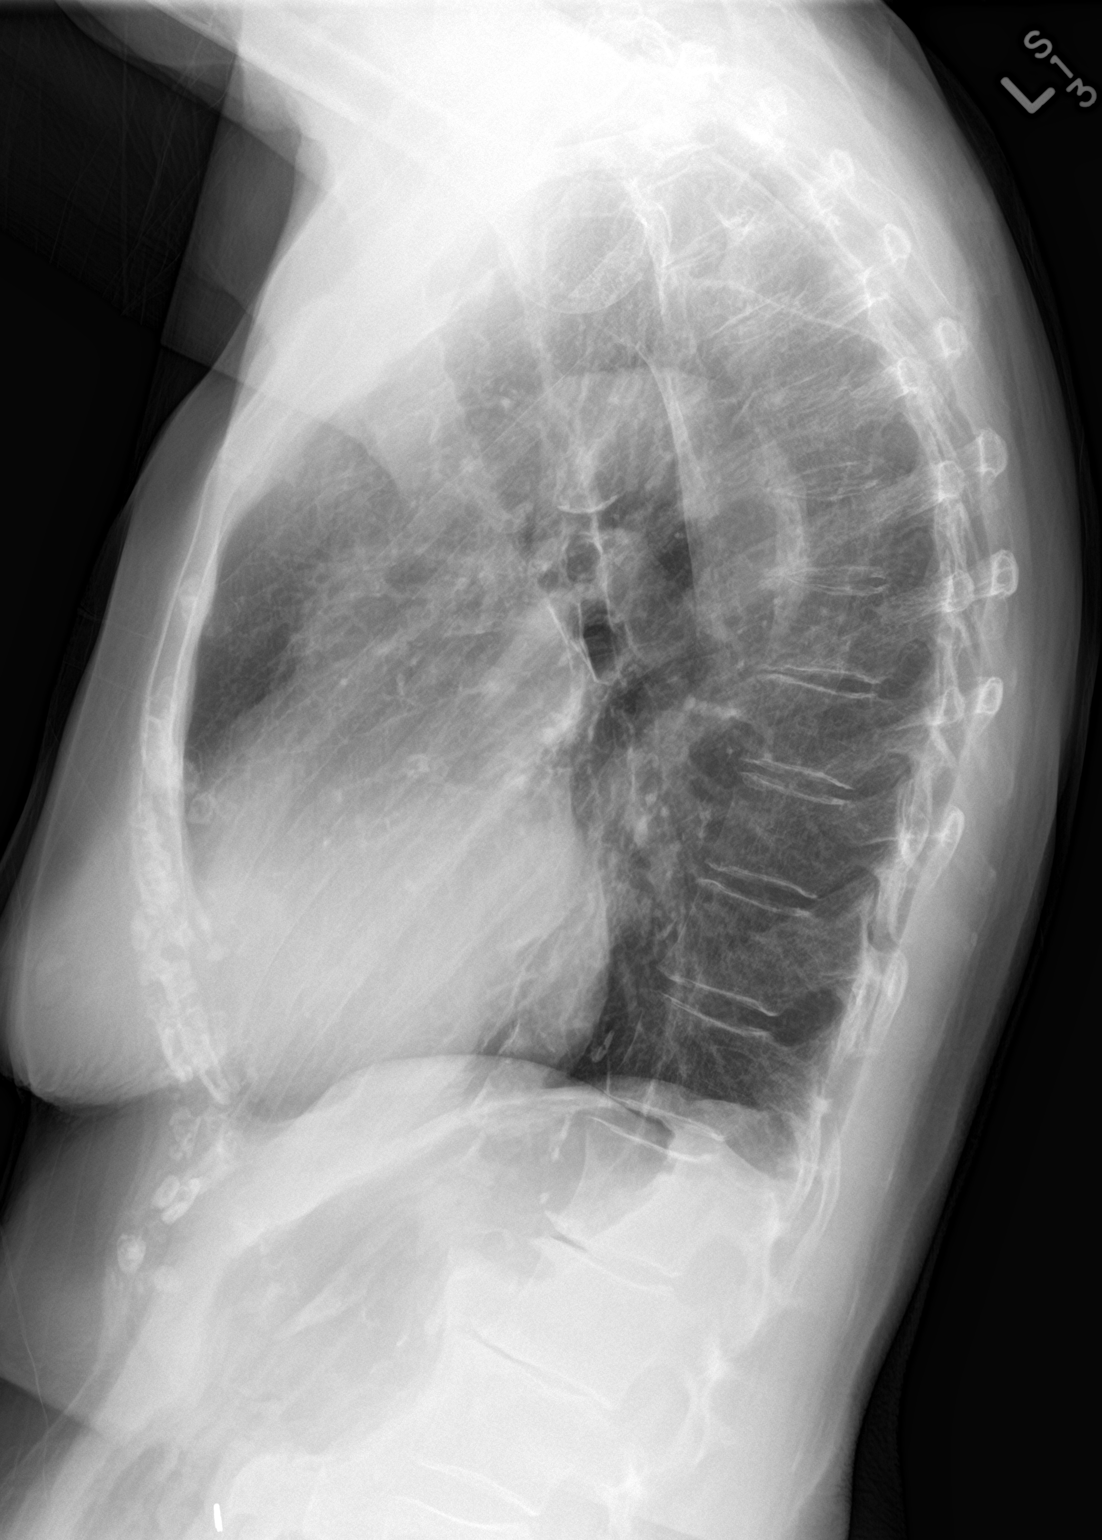

[2 of 2 positions shown; findings below may reference images not displayed]

FINDINGS: Mediastinum hilar structures normal. Cardiomegaly with normal
pulmonary vascularity. Heart size is increased slightly from prior
exam P No focal infiltrate. No pleural effusion or pneumothorax.
Degenerative changes thoracic spine.
IMPRESSION: Mild increasing cardiomegaly. No pulmonary venous congestion. No
focal pulmonary infiltrate.

## 2016-07-28 ENCOUNTER — Ambulatory Visit (INDEPENDENT_AMBULATORY_CARE_PROVIDER_SITE_OTHER): Payer: Medicare Other | Admitting: Internal Medicine

## 2016-07-28 ENCOUNTER — Encounter: Payer: Self-pay | Admitting: Internal Medicine

## 2016-07-28 DIAGNOSIS — Z91018 Allergy to other foods: Secondary | ICD-10-CM | POA: Diagnosis not present

## 2016-07-28 DIAGNOSIS — J449 Chronic obstructive pulmonary disease, unspecified: Secondary | ICD-10-CM

## 2016-07-28 DIAGNOSIS — J3089 Other allergic rhinitis: Secondary | ICD-10-CM

## 2016-07-28 DIAGNOSIS — J302 Other seasonal allergic rhinitis: Secondary | ICD-10-CM | POA: Diagnosis not present

## 2016-07-28 NOTE — Progress Notes (Signed)
Female never smoker followed for asthma/COPD, rhinitis, complicated by HBP   07/23/15- 82 year old female never smoker followed for asthma/COPD, rhinitis, a1ATMZ,  complicated by HBP FOLLOWS FOR:SOB staying the same with exertion, no cough or wheeze,denies cp or tightness More dyspnea on exertion noticed when she is cleaning her home, but nothing very abrupt or specific. Still goes to exercise class III times per week and walks 1 mile on treadmill, but now says it flat instead of with slight elevation. No cough or wheeze. New problem-knot noted left side of neck 1 month, nontender. Office Spirometry 07/23/2015-moderately severe restriction of exhaled volume. FVC 1.53/54%, FEV1 1.12/55%, FEV1/FVC 0.73, FEF 25-75%0.89.  07/28/2016-82 year old female never smoker followed for asthma/COPD, rhinitis, a1AT MZ, located by HBP FOLLOWS FOR: Pt states she has been doing well; been able to exercise. Pt denies any SOB, wheezing, cough, or congestion. Pt would like to get regular dose of flu shot.  Reports doing really well with no cough. Continues use of Advair but never uses rescue inhaler, just wants to keep it available. Cites hx egg allergy for wanting only regular strength flu vax. Tolerates this well each year. Describes anaphlalctic response to chocolate. Careful to avoid problem foods and has had no recent experience. CXR 07/23/2015 IMPRESSION: Mild increasing cardiomegaly. No pulmonary venous congestion. No focal pulmonary infiltrate. Electronically Signed   By: Maisie Fushomas  Register   On: 07/23/2015 14:44  ROS-See HPI              += pos Constitutional:   No-   weight loss, night sweats, fevers, chills, fatigue, lassitude. HEENT:   No-  headaches, difficulty swallowing, tooth/dental problems, sore throat,       No-  sneezing, itching, ear ache, nasal congestion, post nasal drip,  CV:  No-   chest pain, orthopnea, PND, swelling in lower extremities, anasarca, dizziness, palpitations Resp: +  shortness of breath with exertion or at rest.              No-   productive cough,  non-productive cough,  No- coughing up of blood.              No-   change in color of mucus.  No- wheezing.   Skin: No-   rash or lesions. GI:  No-   heartburn, indigestion, abdominal pain, nausea, vomiting,  GU: . MS:  No-   joint pain or swelling.   Neuro-     nothing unusual Psych:  No- change in mood or affect. No depression or anxiety.  No memory loss.  OBJ General- Alert, Oriented, Affect-appropriate, Distress- none acute;  Skin- rash-none, lesions- none, excoriation- none Lymphadenopathy- + firm left cervical node adjacent to carotid artery Head- atraumatic            Eyes- Gross vision intact, PERRLA, conjunctivae clear secretions            Ears- Hearing, canals-normal            Nose- Clear, no-Septal dev, mucus, polyps, erosion, perforation             Throat- Mallampati II , mucosa clear , drainage- none, tonsils- atrophic Neck- flexible , trachea midline, no stridor , thyroid nl, carotid no bruit Chest - symmetrical excursion , unlabored           Heart/CV- RRR , no murmur , no gallop  , no rub, nl s1 s2                           -  JVD- none , edema- none, stasis changes- none, varices- none           Lung- clear to P&A, wheeze- none, cough- none , dullness-none, rub- none           Chest wall-  Abd-  Br/ Gen/ Rectal- Not done, not indicated Extrem- cyanosis- none, clubbing, none, atrophy- none, strength- nl Neuro- grossly intact to observation     

## 2016-07-28 NOTE — Patient Instructions (Signed)
Flu vax- regular  Ok to call for inhaler refills as needed

## 2016-07-28 NOTE — Assessment & Plan Note (Signed)
Satisfactory control. What nasal symptoms she recognizes now are more likely related to vasomotor rhinitis at her age than to atopy.

## 2016-07-28 NOTE — Assessment & Plan Note (Signed)
Appropriately avoids foods she associates with problems. No significant events in a long time.

## 2016-07-28 NOTE — Assessment & Plan Note (Signed)
She feels Advair is useful. This wants to keep a rescue inhaler around but denies wheeze or sleep disturbance. Medication talk done and refills available when needed. Flu vaccine.

## 2017-02-17 DIAGNOSIS — M810 Age-related osteoporosis without current pathological fracture: Secondary | ICD-10-CM | POA: Insufficient documentation

## 2017-02-17 DIAGNOSIS — M19041 Primary osteoarthritis, right hand: Secondary | ICD-10-CM | POA: Insufficient documentation

## 2017-02-17 DIAGNOSIS — M5030 Other cervical disc degeneration, unspecified cervical region: Secondary | ICD-10-CM | POA: Insufficient documentation

## 2017-02-17 DIAGNOSIS — M19042 Primary osteoarthritis, left hand: Secondary | ICD-10-CM

## 2017-02-17 NOTE — Progress Notes (Signed)
Office Visit Note  Patient: [name]             Date of Birth: May 08, 1929           MRN: [id number]             PCP: Vernona Rieger, MD Referring: Vernona Rieger, MD Visit Date: 03/02/2017 Occupation: @GUAROCC @    Subjective:  Lower back pain.   History of Present Illness: [name] is a 83 y.o. female with history of osteoarthritis and disc disease. She states she's been having chronic SI joint pain for a while. The pain has recurred now and has been more intense. She is having more pain on the right SI joint. Her neck is doing all right currently. She does have limited range of motion with lateral rotation. She does not have much pain in her hands do get some stiffness.  Activities of Daily Living:  Patient reports morning stiffness for15 minutes.   Patient Reports nocturnal pain.  Difficulty dressing/grooming: Denies Difficulty climbing stairs: Reports Difficulty getting out of chair: Reports Difficulty using hands for taps, buttons, cutlery, and/or writing: Denies   Review of Systems  Constitutional: Positive for fatigue. Negative for night sweats, weight gain, weight loss and weakness.  HENT: Negative for mouth sores, trouble swallowing, trouble swallowing, mouth dryness and nose dryness.   Eyes: Positive for dryness. Negative for pain, redness and visual disturbance.  Respiratory: Negative for cough, shortness of breath and difficulty breathing.   Cardiovascular: Negative for chest pain, palpitations, hypertension, irregular heartbeat and swelling in legs/feet.  Gastrointestinal: Negative for blood in stool, constipation and diarrhea.  Endocrine: Negative for increased urination.  Genitourinary: Negative for vaginal dryness.  Musculoskeletal: Positive for arthralgias and joint pain. Negative for joint swelling, myalgias, muscle weakness, morning stiffness, muscle tenderness and myalgias.  Skin: Negative for color change, rash, hair loss, skin  tightness, ulcers and sensitivity to sunlight.  Allergic/Immunologic: Negative for susceptible to infections.  Neurological: Negative for dizziness, memory loss and night sweats.  Hematological: Negative for swollen glands.  Psychiatric/Behavioral: Negative for depressed mood and sleep disturbance. The patient is nervous/anxious.     PMFS History:  Patient Active Problem List   Diagnosis Date Noted  . Primary osteoarthritis of both hands 02/17/2017  . DDD (degenerative disc disease), cervical 02/17/2017  . Age-related osteoporosis without current pathological fracture 02/17/2017  . Food allergy 07/28/2016  . Cervical adenopathy 07/24/2015  . Rosacea blepharoconjunctivitis 07/25/2013  . Musculoskeletal pain 07/25/2013  . Seasonal and perennial allergic rhinitis 07/26/2012  . Rhinitis 07/18/2011  . HYPERTENSION 11/25/2007  . COPD mixed type (HCC) 11/25/2007  . ASTHMA 11/19/2007    Past Medical History:  Diagnosis Date  . Asthma   . COPD (chronic obstructive pulmonary disease) (HCC)   . Diverticulosis   . Hypertension   . IBS (irritable bowel syndrome)   . Rosacea     Family History  Problem Relation Age of Onset  . Asthma Unknown   . Arthritis Unknown   . Cancer Unknown    Past Surgical History:  Procedure Laterality Date  . APPENDECTOMY    . CHOLECYSTECTOMY    . ORIF ACETABULAR FRACTURE     right leg  . STAPEDECTOMY     Social History   Social History Narrative  . No narrative on file     Objective: Vital Signs: BP (!) 110/51 (BP Location: Left Arm, Patient Position: Sitting, Cuff Size: Small)   Pulse 82   Resp 16  Ht 5\' 4"  (1.626 m)   Wt 124 lb (56.2 kg)   BMI 21.28 kg/m    Physical Exam  Constitutional: She is oriented to person, place, and time. She appears well-developed and well-nourished.  HENT:  Head: Normocephalic and atraumatic.  Eyes: Conjunctivae and EOM are normal.  Neck: Normal range of motion.  Cardiovascular: Normal rate, regular  rhythm, normal heart sounds and intact distal pulses.   Pulmonary/Chest: Effort normal and breath sounds normal.  Abdominal: Soft. Bowel sounds are normal.  Lymphadenopathy:    She has no cervical adenopathy.  Neurological: She is alert and oriented to person, place, and time.  Skin: Skin is warm and dry. Capillary refill takes less than 2 seconds.  Psychiatric: She has a normal mood and affect. Her behavior is normal.  Nursing note and vitals reviewed.    Musculoskeletal Exam: C-spine she has limited lateral rotation, thoracic kyphosis, lumbar spine good range of motion. She is some tenderness over SI joints.Shoulder joints elbow joints are good range of motion. She has some DIP PIP thickening. Hip joints knee joints ankles MTPs PIPs were good range of motion with no synovitis.  CDAI Exam: No CDAI exam completed.    Investigation: No additional findings.  Imaging: No results found.  Speciality Comments: No specialty comments available.    Procedures:  Large Joint Inj Date/Time: 03/02/2017 3:27 PM Performed by: Pollyann SavoyEVESHWAR, Keenya Matera Authorized by: Pollyann SavoyEVESHWAR, Melonee Gerstel   Consent Given by:  Patient Site marked: the procedure site was marked   Timeout: prior to procedure the correct patient, procedure, and site was verified   Indications:  Pain Location:  Sacroiliac Site:  R sacroiliac joint Prep: patient was prepped and draped in usual sterile fashion   Needle Size:  27 G Needle Length:  1.5 inches Approach:  Superior Ultrasound Guidance: No   Fluoroscopic Guidance: No   Arthrogram: No   Medications:  1 mL lidocaine 1 %; 30 mg triamcinolone acetonide 40 MG/ML Aspiration Attempted: Yes   Aspirate amount (mL):  0 Patient tolerance:  Patient tolerated the procedure well with no immediate complications Large Joint Inj Date/Time: 03/02/2017 3:28 PM Performed by: Pollyann SavoyEVESHWAR, Madi Bonfiglio Authorized by: Pollyann SavoyEVESHWAR, Mikyla Schachter   Consent Given by:  Patient Site marked: the procedure site was  marked   Timeout: prior to procedure the correct patient, procedure, and site was verified   Indications:  Pain Location:  Sacroiliac Site:  L sacroiliac joint Prep: patient was prepped and draped in usual sterile fashion   Needle Size:  27 G Needle Length:  1.5 inches Approach:  Superior Ultrasound Guidance: No   Fluoroscopic Guidance: No   Arthrogram: No   Medications:  1 mL lidocaine 1 %; 30 mg triamcinolone acetonide 40 MG/ML Aspiration Attempted: Yes   Aspirate amount (mL):  0 Patient tolerance:  Patient tolerated the procedure well with no immediate complications   Allergies: Chocolate; Eggs or egg-derived products; Fish allergy; Other; and Penicillins   Assessment / Plan:     Visit Diagnoses: Pain of both sacroiliac joints: Patient had tenderness in bilateral SI joints. He requested cortisone injection. After different treatment options were discussed and side effects were discussed bilateral SI joints were injected with cortisone as described above. Advised her to monitor blood pressure closely . She also requested a prescription refill for Voltaren gel. Side effects were discussed and prescription refill was given.  DDD (degenerative disc disease), cervical limited range of motion. Exercises were discussed.  Primary osteoarthritis of both hands: Joint protection and muscle strengthening  discussed.  Age-related osteoporosis without current pathological fracture: She states she was on bisphosphonates in the past and has been off bisphosphonates now. I've advised her to have repeat bone density with her PCP if due. Use of calcium and vitamin D was also discussed. Resistive exercises were discussed.  History of hypertension: Her blood pressure is well controlled today.  History of asthma  History of COPD    Orders: Orders Placed This Encounter  Procedures  . Large Joint Injection/Arthrocentesis  . Large Joint Injection/Arthrocentesis   Meds ordered this encounter    Medications  . diclofenac sodium (VOLTAREN) 1 % GEL    Sig: Apply 2 g topically 4 (four) times daily.    Dispense:  3 Tube    Refill:  0    Face-to-face time spent with patient was 30 minutes. 50% of time was spent in counseling and coordination of care.  Follow-Up Instructions: Return in about 6 months (around 09/01/2017) for OA, OP.   Pollyann Savoy, MD  Note - This record has been created using Animal nutritionist.  Chart creation errors have been sought, but may not always  have been located. Such creation errors do not reflect on  the standard of medical care.

## 2017-03-02 ENCOUNTER — Ambulatory Visit (INDEPENDENT_AMBULATORY_CARE_PROVIDER_SITE_OTHER): Payer: Medicare Other | Admitting: Rheumatology

## 2017-03-02 ENCOUNTER — Encounter: Payer: Self-pay | Admitting: Rheumatology

## 2017-03-02 VITALS — BP 110/51 | HR 82 | Resp 16 | Ht 64.0 in | Wt 124.0 lb

## 2017-03-02 DIAGNOSIS — M810 Age-related osteoporosis without current pathological fracture: Secondary | ICD-10-CM | POA: Diagnosis not present

## 2017-03-02 DIAGNOSIS — M19042 Primary osteoarthritis, left hand: Secondary | ICD-10-CM

## 2017-03-02 DIAGNOSIS — M19041 Primary osteoarthritis, right hand: Secondary | ICD-10-CM | POA: Diagnosis not present

## 2017-03-02 DIAGNOSIS — Z8679 Personal history of other diseases of the circulatory system: Secondary | ICD-10-CM

## 2017-03-02 DIAGNOSIS — Z8709 Personal history of other diseases of the respiratory system: Secondary | ICD-10-CM | POA: Diagnosis not present

## 2017-03-02 DIAGNOSIS — M533 Sacrococcygeal disorders, not elsewhere classified: Secondary | ICD-10-CM | POA: Diagnosis not present

## 2017-03-02 DIAGNOSIS — M5030 Other cervical disc degeneration, unspecified cervical region: Secondary | ICD-10-CM

## 2017-03-02 MED ORDER — DICLOFENAC SODIUM 1 % TD GEL: 2.0000 g | Freq: Four times a day (QID) | TRANSDERMAL | 0 refills | Status: DC

## 2017-03-02 MED ORDER — LIDOCAINE HCL 1 % IJ SOLN
1.0000 mL | INTRAMUSCULAR | Status: AC | PRN
  Administered 2017-03-02: 1 mL

## 2017-03-02 MED ORDER — TRIAMCINOLONE ACETONIDE 40 MG/ML IJ SUSP
30.0000 mg | INTRAMUSCULAR | Status: AC | PRN
  Administered 2017-03-02: 30 mg via INTRA_ARTICULAR

## 2017-03-02 NOTE — Patient Instructions (Signed)
Please discuss bone density scan annual vitamin D level with the primary care physician.

## 2017-08-03 ENCOUNTER — Encounter: Payer: Self-pay | Admitting: Allergy and Immunology

## 2017-08-03 ENCOUNTER — Ambulatory Visit (INDEPENDENT_AMBULATORY_CARE_PROVIDER_SITE_OTHER): Payer: Medicare Other | Admitting: Allergy and Immunology

## 2017-08-03 VITALS — BP 160/76 | HR 64 | Temp 97.9°F | Resp 22 | Ht 63.5 in | Wt 125.9 lb

## 2017-08-03 DIAGNOSIS — J3089 Other allergic rhinitis: Secondary | ICD-10-CM | POA: Diagnosis not present

## 2017-08-03 DIAGNOSIS — T7800XD Anaphylactic reaction due to unspecified food, subsequent encounter: Secondary | ICD-10-CM | POA: Diagnosis not present

## 2017-08-03 DIAGNOSIS — J302 Other seasonal allergic rhinitis: Secondary | ICD-10-CM | POA: Diagnosis not present

## 2017-08-03 DIAGNOSIS — R197 Diarrhea, unspecified: Secondary | ICD-10-CM

## 2017-08-03 DIAGNOSIS — T7800XA Anaphylactic reaction due to unspecified food, initial encounter: Secondary | ICD-10-CM | POA: Insufficient documentation

## 2017-08-03 DIAGNOSIS — R634 Abnormal weight loss: Secondary | ICD-10-CM | POA: Insufficient documentation

## 2017-08-03 DIAGNOSIS — J4530 Mild persistent asthma, uncomplicated: Secondary | ICD-10-CM | POA: Insufficient documentation

## 2017-08-03 MED ORDER — FLUTICASONE PROPIONATE HFA 110 MCG/ACT IN AERO: 2.0000 | INHALATION_SPRAY | Freq: Two times a day (BID) | RESPIRATORY_TRACT | 5 refills | Status: AC

## 2017-08-03 MED ORDER — EPINEPHRINE 0.3 MG/0.3ML IJ SOAJ: 0.3000 mg | Freq: Once | INTRAMUSCULAR | 1 refills | Status: AC

## 2017-08-03 NOTE — Assessment & Plan Note (Signed)
The patient's history suggests tree nut allergy and positive skin test results today confirm this diagnosis. Skin test today were positive to [place] nut, coconut, ginger, and black pepper.  Food allergen skin tests were negative to green peas, apples, and chocolate.  The negative predictive value for food allergen skin testing is excellent, however there is still a 5% chance that the allergy exists.  Therefore, we will confirm these results with blood tests.  A laboratory order form has been provided for serum specific IgE against chocolate, apple, green pea, bean panel, tree nut panel, coconut, ginger, and black pepper.  Meticulous avoidance of tree nuts and, for now, continue careful avoidance of chocolate, peas, beans and apples.  A prescription has been provided for epinephrine auto-injector 2 pack along with instructions for proper administration.  A food allergy action plan has been provided and discussed.  Medic Alert identification is recommended.

## 2017-08-03 NOTE — Assessment & Plan Note (Addendum)
As her lower respiratory symptoms have been quiescent over the past 3 years while taking 1 inhalation daily of Advair, we will attempt a stepdown therapy at this time.   A prescription has been provided for Flovent (fluticasone) 110 g, 2 inhalations twice a day. To maximize pulmonary deposition, a spacer has been provided along with instructions for its proper administration with an HFA inhaler.  If lower respiratory symptoms progress in frequency and/or severity, the patient is to resume the previous dose of Advair 100/50 mcg.

## 2017-08-03 NOTE — Assessment & Plan Note (Signed)
Allergic rhinitis with a probable nonallergic component.  Aeroallergen avoidance measures have been discussed and provided in written form.  Continue fluticasone nasal spray, 2 sprays per nostril daily if needed.  Proper nasal spray technique has been discussed and demonstrated.  Nasal saline spray (i.e. Simply Saline) is recommended prior to medicated nasal sprays and as needed.

## 2017-08-03 NOTE — Assessment & Plan Note (Signed)
   Avoid any foods thought to trigger diarrhea.  If this problem persists or progresses despite avoidance of all the foods listed above, gastroenterology consultation is warranted.

## 2017-08-03 NOTE — Assessment & Plan Note (Signed)
Given her recent, unexplained weight loss we will check screening labs.  A laboratory order form has been provided for CBC with differential and CMP.

## 2017-08-03 NOTE — Progress Notes (Signed)
New Patient Note  RE: [name] MRN: [id number] DOB: 09-07-1929 Date of Office Visit: 08/03/2017  Referring provider: Vernona Rieger, MD Primary care provider: Vernona Rieger, MD  Chief Complaint: Allergic Reaction (food allergy); Other (diarrhea); and Allergic Rhinitis    History of present illness: [name] is a 83 y.o. female seen today in consultation requested by Vernona Rieger, MD.  She reports that when she was a child she consumed chocolate and within minutes developed swelling of the lips and eyelids as well as the sensation of throat tightness.  This episode required evaluation and treatment in the local emergency department.  She has done her best to avoid chocolate since that time, however on one occasion she did accidentally ingest of a very small amount of chocolate and had a systemic reaction requiring treatment in the emergency department.  She reports that she does not eat eggs because on one occasion, raw egg came into contact with her skin and caused localized urticaria.  When she consumes [place] nuts, green peas, or butter beans, she experiences generalized pruritus and urticaria.  She reports that consuming apples results in diarrhea.  She avoids all the above listed foods, however over the past for 5 months she has been experiencing frequent diarrhea and is concerned that she has developed a new food or medication allergy. She has lost 5 pounds over the past several months, she believes because of the frequent diarrhea, and this concerns her. [name] reports that she was diagnosed with asthma child.  She has been taking Advair 100/50 g, 1 inhalation once daily over the past 3 or 4 years.  She states that over the past years she has not required asthma rescue medication, experienced nocturnal awakenings due to lower respiratory symptoms, nor have activities of daily living been limited.  She is a lifelong non-smoker, however was exposed to secondhand  cigarette smoke from her father and husband. She experiences nasal congestion, rhinorrhea, sneezing, and nasal pruritus.  These symptoms occur year around but are specifically triggered by exposure to mown grass.   Assessment and plan: Food allergy The patient's history suggests tree nut allergy and positive skin test results today confirm this diagnosis. Skin test today were positive to [place] nut, coconut, ginger, and black pepper.  Food allergen skin tests were negative to green peas, apples, and chocolate.  The negative predictive value for food allergen skin testing is excellent, however there is still a 5% chance that the allergy exists.  Therefore, we will confirm these results with blood tests.  A laboratory order form has been provided for serum specific IgE against chocolate, apple, green pea, bean panel, tree nut panel, coconut, ginger, and black pepper.  Meticulous avoidance of tree nuts and, for now, continue careful avoidance of chocolate, peas, beans and apples.  A prescription has been provided for epinephrine auto-injector 2 pack along with instructions for proper administration.  A food allergy action plan has been provided and discussed.  Medic Alert identification is recommended.  Diarrhea  Avoid any foods thought to trigger diarrhea.  If this problem persists or progresses despite avoidance of all the foods listed above, gastroenterology consultation is warranted.  Seasonal and perennial allergic rhinitis Allergic rhinitis with a probable nonallergic component.  Aeroallergen avoidance measures have been discussed and provided in written form.  Continue fluticasone nasal spray, 2 sprays per nostril daily if needed.  Proper nasal spray technique has been discussed and demonstrated.  Nasal saline spray (i.e. Simply Saline) is recommended  prior to medicated nasal sprays and as needed.  Weight loss Given her recent, unexplained weight loss we will check screening  labs.  A laboratory order form has been provided for CBC with differential and CMP.  Mild persistent asthma As her lower respiratory symptoms have been quiescent over the past 3 years while taking 1 inhalation daily of Advair, we will attempt a stepdown therapy at this time.   A prescription has been provided for Flovent (fluticasone) 110 g,  2 inhalations twice a day. To maximize pulmonary deposition, a spacer has been provided along with instructions for its proper administration with an HFA inhaler.  If lower respiratory symptoms progress in frequency and/or severity, the patient is to resume the previous dose of Advair 100/50 mcg.   Meds ordered this encounter  Medications  . EPINEPHrine 0.3 mg/0.3 mL IJ SOAJ injection    Sig: Inject 0.3 mLs (0.3 mg total) into the muscle once.    Dispense:  2 Device    Refill:  1  . fluticasone (FLOVENT HFA) 110 MCG/ACT inhaler    Sig: Inhale 2 puffs into the lungs 2 (two) times daily.    Dispense:  1 Inhaler    Refill:  5    Diagnostics: Spirometry: FVC was 1.46 L and FEV1 was 1.07 L, FEV1 ratio 100%.  Moderate restriction without significant postbronchodilator improvement.  Please see scanned spirometry results for details. Epicutaneous testing: Positive to mold and dust mite antigen. Intradermal testing: Positive to grass pollen, weed pollen, molds, cat hair, dog epithelia, and cockroach antigen. Food allergen skin testing: Robust positive to [place] nut, positive to coconut, ginger, and black pepper.    Physical examination: Blood pressure (!) 160/76, pulse 64, temperature 97.9 F (36.6 C), temperature source Oral, resp. rate (!) 22, height 5' 3.5" (1.613 m), weight 125 lb 14.1 oz (57.1 kg), SpO2 97 %.  General: Alert, interactive, in no acute distress. HEENT: TMs pearly gray, turbinates moderately edematous with clear discharge, post-pharynx mildly erythematous. Neck: Supple without lymphadenopathy. Lungs: Clear to auscultation without  wheezing, rhonchi or rales. CV: Normal S1, S2 without murmurs. Abdomen: Nondistended, nontender. Skin: Warm and dry, without lesions or rashes. Extremities:  No clubbing, cyanosis or edema. Neuro:   Grossly intact.  Review of systems:  Review of systems negative except as noted in HPI / PMHx or noted below: Review of Systems  Constitutional: Negative.   HENT: Negative.   Eyes: Negative.   Respiratory: Negative.   Cardiovascular: Negative.   Gastrointestinal: Negative.   Genitourinary: Negative.   Musculoskeletal: Negative.   Skin: Negative.   Neurological: Negative.   Endo/Heme/Allergies: Negative.   Psychiatric/Behavioral: Negative.     Past medical history:  Past Medical History:  Diagnosis Date  . Asthma   . COPD (chronic obstructive pulmonary disease) (HCC)   . Diverticulosis   . Hypertension   . IBS (irritable bowel syndrome)   . Rosacea     Past surgical history:  Past Surgical History:  Procedure Laterality Date  . ADENOIDECTOMY    . APPENDECTOMY    . CHOLECYSTECTOMY    . ORIF ACETABULAR FRACTURE     right leg  . STAPEDECTOMY    . TONSILLECTOMY      Family history: Family History  Problem Relation Age of Onset  . Asthma Unknown   . Arthritis Unknown   . Cancer Unknown     Social history: Social History   Social History  . Marital status: Widowed    Spouse name: N/A  .  Number of children: N/A  . Years of education: N/A   Occupational History  . Not on file.   Social History Main Topics  . Smoking status: Never Smoker  . Smokeless tobacco: Never Used  . Alcohol use No  . Drug use: No  . Sexual activity: Not on file   Other Topics Concern  . Not on file   Social History Narrative  . No narrative on file   Environmental History: The patient lives in a 81 year old apartment with carpeting the bedroom and central air/heat.  She is a non-smoker without pets.  There is no known mold/water damage in the home.  Allergies as of 08/03/2017        Reactions   Chocolate    Cardiac arrest, facial swelling   Eggs Or Egg-derived Products    Fish Allergy    Cardiac arrest, facial swelling   Other    [place] nuts, garden peas, and lima beans   Penicillins       Medication List       Accurate as of 08/03/17  6:27 PM. Always use your most recent med list.          ADVAIR DISKUS 100-50 MCG/DOSE Aepb Generic drug:  Fluticasone-Salmeterol INHALE ONE DOSE BY MOUTH TWICE DAILY   ADVIL 200 MG Caps Generic drug:  Ibuprofen Take by mouth as needed.   albuterol 108 (90 Base) MCG/ACT inhaler Commonly known as:  PROAIR HFA Inhale 2 puffs into the lungs every 6 (six) hours as needed for shortness of breath.   amLODipine 10 MG tablet Commonly known as:  NORVASC 1 tablet daily.   aspirin 81 MG tablet Take 81 mg by mouth daily.   CALCIUM 600 + D PO Take by mouth 2 (two) times daily.   COREG 12.5 MG tablet Generic drug:  carvedilol Take 12.5 mg by mouth. Take 1 tablet two times a day   diclofenac sodium 1 % Gel Commonly known as:  VOLTAREN Apply 2 g topically 4 (four) times daily.   EPINEPHrine 0.3 mg/0.3 mL Soaj injection Commonly known as:  EPI-PEN Inject 0.3 mLs (0.3 mg total) into the muscle once.   fluticasone 110 MCG/ACT inhaler Commonly known as:  FLOVENT HFA Inhale 2 puffs into the lungs 2 (two) times daily.   fluticasone 50 MCG/ACT nasal spray Commonly known as:  FLONASE USE TWO SPRAYS IN EACH NOSTRIL ONCE DAILY   lisinopril 40 MG tablet Commonly known as:  PRINIVIL,ZESTRIL TAKE ONE TABLET BY MOUTH TWICE DAILY   metoprolol succinate 50 MG 24 hr tablet Commonly known as:  TOPROL-XL Take 50 mg by mouth daily.   metroNIDAZOLE 0.75 % cream Commonly known as:  METROCREAM Apply topically as needed.   minocycline 50 MG capsule Commonly known as:  MINOCIN,DYNACIN Take 50 mg by mouth as needed.   multivitamin per tablet Take 1 tablet by mouth daily.   omeprazole 40 MG capsule Commonly known as:   PRILOSEC Take by mouth.       Known medication allergies: Allergies  Allergen Reactions  . Chocolate     Cardiac arrest, facial swelling  . Eggs Or Egg-Derived Products   . Fish Allergy     Cardiac arrest, facial swelling  . Other     [place] nuts, garden peas, and lima beans   . Penicillins     I appreciate the opportunity to take part in Katiana's care. Please do not hesitate to contact me with questions.  Sincerely,   [employer],  MD

## 2017-08-03 NOTE — Patient Instructions (Addendum)
Food allergy The patient's history suggests tree nut allergy and positive skin test results today confirm this diagnosis. Skin test today were positive to [place] nut, coconut, ginger, and black pepper.  Food allergen skin tests were negative to green peas, apples, and chocolate.  The negative predictive value for food allergen skin testing is excellent, however there is still a 5% chance that the allergy exists.  Therefore, we will confirm these results with blood tests.  A laboratory order form has been provided for serum specific IgE against chocolate, apple, green pea, bean panel, tree nut panel, coconut, ginger, and black pepper.  Meticulous avoidance of tree nuts and, for now, continue careful avoidance of chocolate, peas, beans and apples.  A prescription has been provided for epinephrine auto-injector 2 pack along with instructions for proper administration.  A food allergy action plan has been provided and discussed.  Medic Alert identification is recommended.  Diarrhea  Avoid any foods thought to trigger diarrhea.  If this problem persists or progresses despite avoidance of all the foods listed above, gastroenterology consultation is warranted.  Seasonal and perennial allergic rhinitis Allergic rhinitis with a probable nonallergic component.  Aeroallergen avoidance measures have been discussed and provided in written form.  Continue fluticasone nasal spray, 2 sprays per nostril daily if needed.  Proper nasal spray technique has been discussed and demonstrated.  Nasal saline spray (i.e. Simply Saline) is recommended prior to medicated nasal sprays and as needed.  Weight loss Given her recent, unexplained weight loss we will check screening labs.  A laboratory order form has been provided for CBC with differential and CMP.  Mild persistent asthma As her lower respiratory symptoms have been quiescent over the past 3 years while taking 1 inhalation daily of Advair, we will  attempt a stepdown therapy at this time.   A prescription has been provided for Flovent (fluticasone) 110 g,  2 inhalations twice a day. To maximize pulmonary deposition, a spacer has been provided along with instructions for its proper administration with an HFA inhaler.  If lower respiratory symptoms progress in frequency and/or severity, the patient is to resume the previous dose.   When lab results have returned the patient will be called with further recommendations and follow up instructions.  Reducing Pollen Exposure  The American Academy of Allergy, Asthma and Immunology suggests the following steps to reduce your exposure to pollen during allergy seasons.    1. Do not hang sheets or clothing out to dry; pollen may collect on these items. 2. Do not mow lawns or spend time around freshly cut grass; mowing stirs up pollen. 3. Keep windows closed at night.  Keep car windows closed while driving. 4. Minimize morning activities outdoors, a time when pollen counts are usually at their highest. 5. Stay indoors as much as possible when pollen counts or humidity is high and on windy days when pollen tends to remain in the air longer. 6. Use air conditioning when possible.  Many air conditioners have filters that trap the pollen spores. 7. Use a HEPA room air filter to remove pollen form the indoor air you breathe.   Control of House Dust Mite Allergen  House dust mites play a major role in allergic asthma and rhinitis.  They occur in environments with high humidity wherever human skin, the food for dust mites is found. High levels have been detected in dust obtained from mattresses, pillows, carpets, upholstered furniture, bed covers, clothes and soft toys.  The principal allergen of the  house dust mite is found in its feces.  A gram of dust may contain 1,000 mites and 250,000 fecal particles.  Mite antigen is easily measured in the air during house cleaning activities.    1. Encase  mattresses, including the box spring, and pillow, in an air tight cover.  Seal the zipper end of the encased mattresses with wide adhesive tape. 2. Wash the bedding in water of 130 degrees Farenheit weekly.  Avoid cotton comforters/quilts and flannel bedding: the most ideal bed covering is the dacron comforter. 3. Remove all upholstered furniture from the bedroom. 4. Remove carpets, carpet padding, rugs, and non-washable window drapes from the bedroom.  Wash drapes weekly or use plastic window coverings. 5. Remove all non-washable stuffed toys from the bedroom.  Wash stuffed toys weekly. 6. Have the room cleaned frequently with a vacuum cleaner and a damp dust-mop.  The patient should not be in a room which is being cleaned and should wait 1 hour after cleaning before going into the room. 7. Close and seal all heating outlets in the bedroom.  Otherwise, the room will become filled with dust-laden air.  An electric heater can be used to heat the room. Reduce indoor humidity to less than 50%.  Do not use a humidifier.  Control of Dog or Cat Allergen  Avoidance is the best way to manage a dog or cat allergy. If you have a dog or cat and are allergic to dog or cats, consider removing the dog or cat from the home. If you have a dog or cat but don't want to find it a new home, or if your family wants a pet even though someone in the household is allergic, here are some strategies that may help keep symptoms at bay:  1. Keep the pet out of your bedroom and restrict it to only a few rooms. Be advised that keeping the dog or cat in only one room will not limit the allergens to that room. 2. Don't pet, hug or kiss the dog or cat; if you do, wash your hands with soap and water. 3. High-efficiency particulate air (HEPA) cleaners run continuously in a bedroom or living room can reduce allergen levels over time. 4. Place electrostatic material sheet in the air inlet vent in the bedroom. 5. Regular use of a  high-efficiency vacuum cleaner or a central vacuum can reduce allergen levels. 6. Giving your dog or cat a bath at least once a week can reduce airborne allergen.  Control of Mold Allergen  Mold and fungi can grow on a variety of surfaces provided certain temperature and moisture conditions exist.  Outdoor molds grow on plants, decaying vegetation and soil.  The major outdoor mold, Alternaria and Cladosporium, are found in very high numbers during hot and dry conditions.  Generally, a late Summer - Fall peak is seen for common outdoor fungal spores.  Rain will temporarily lower outdoor mold spore count, but counts rise rapidly when the rainy period ends.  The most important indoor molds are Aspergillus and Penicillium.  Dark, humid and poorly ventilated basements are ideal sites for mold growth.  The next most common sites of mold growth are the bathroom and the kitchen.  Outdoor [employer] 1. Use air conditioning and keep windows closed 2. Avoid exposure to decaying vegetation. 3. Avoid leaf raking. 4. Avoid grain handling. 5. Consider wearing a face mask if working in moldy areas.  Indoor Mold Control 1. Maintain humidity below 50%. 2. Clean washable  surfaces with 5% bleach solution. 3. Remove sources e.g. Contaminated carpets.  Control of Cockroach Allergen  Cockroach allergen has been identified as an important cause of acute attacks of asthma, especially in urban settings.  There are fifty-five species of cockroach that exist in the [place], however only three, the [place], [place] and Oriental species produce allergen that can affect patients with Asthma.  Allergens can be obtained from fecal particles, egg casings and secretions from cockroaches.    1. Remove food sources. 2. Reduce access to water. 3. Seal access and entry points. 4. Spray runways with 0.5-1% Diazinon or Chlorpyrifos 5. Blow boric acid power under stoves and refrigerator. 6. Place bait stations  (hydramethylnon) at feeding sites.

## 2017-08-06 LAB — CBC WITH DIFFERENTIAL/PLATELET
Basophils Absolute: 0 10*3/uL (ref 0.0–0.2)
Basos: 1 %
EOS (ABSOLUTE): 0.2 10*3/uL (ref 0.0–0.4)
Eos: 2 %
Hematocrit: 38.5 % (ref 34.0–46.6)
Hemoglobin: 12.9 g/dL (ref 11.1–15.9)
Immature Grans (Abs): 0 10*3/uL (ref 0.0–0.1)
Immature Granulocytes: 0 %
Lymphocytes Absolute: 1.9 10*3/uL (ref 0.7–3.1)
Lymphs: 27 %
MCH: 29.4 pg (ref 26.6–33.0)
MCHC: 33.5 g/dL (ref 31.5–35.7)
MCV: 88 fL (ref 79–97)
Monocytes Absolute: 0.6 10*3/uL (ref 0.1–0.9)
Monocytes: 8 %
Neutrophils Absolute: 4.4 10*3/uL (ref 1.4–7.0)
Neutrophils: 62 %
Platelets: 405 10*3/uL — ABNORMAL HIGH (ref 150–379)
RBC: 4.39 x10E6/uL (ref 3.77–5.28)
RDW: 14 % (ref 12.3–15.4)
WBC: 7.1 10*3/uL (ref 3.4–10.8)

## 2017-08-06 LAB — COMPREHENSIVE METABOLIC PANEL
ALT: 9 IU/L (ref 0–32)
AST: 21 IU/L (ref 0–40)
Albumin/Globulin Ratio: 1.5 (ref 1.2–2.2)
Albumin: 4.4 g/dL (ref 3.5–4.7)
Alkaline Phosphatase: 85 IU/L (ref 39–117)
BUN/Creatinine Ratio: 25 (ref 12–28)
BUN: 15 mg/dL (ref 8–27)
Bilirubin Total: 0.3 mg/dL (ref 0.0–1.2)
CO2: 26 mmol/L (ref 20–29)
Calcium: 9.8 mg/dL (ref 8.7–10.3)
Chloride: 97 mmol/L (ref 96–106)
Creatinine, Ser: 0.59 mg/dL (ref 0.57–1.00)
GFR calc Af Amer: 96 mL/min/{1.73_m2} (ref >59)
GFR calc non Af Amer: 83 mL/min/{1.73_m2} (ref >59)
Globulin, Total: 2.9 g/dL (ref 1.5–4.5)
Glucose: 98 mg/dL (ref 65–99)
Potassium: 4.5 mmol/L (ref 3.5–5.2)
Sodium: 136 mmol/L (ref 134–144)
Total Protein: 7.3 g/dL (ref 6.0–8.5)

## 2017-08-06 LAB — ALLERGY PANEL 18, NUT MIX GROUP
Allergen Coconut IgE: <0.1 kU/L
F020-IgE Almond: <0.1 kU/L
F202-IgE Cashew Nut: <0.1 kU/L
Hazelnut (Filbert) IgE: <0.1 kU/L
Peanut IgE: <0.1 kU/L
Pecan Nut IgE: <0.1 kU/L
Sesame Seed IgE: <0.1 kU/L

## 2017-08-06 LAB — ALLERGEN, APPLE F49: Allergen Apple, IgE: <0.1 kU/L

## 2017-08-06 LAB — ALLERGEN, BLACK PEPPER,F280: Allergen Black Pepper IgE: <0.1 kU/L

## 2017-08-06 LAB — ALLERGEN PEA F12: Allergen Green Pea IgE: <0.1 kU/L

## 2017-08-06 LAB — ALLERGEN, GINGER, RF270: Allergen Ginger IgE: <0.1 kU/L

## 2017-08-06 LAB — ALLERGEN CHOCOLATE: Chocolate/Cacao IgE: <0.1 kU/L

## 2017-08-06 LAB — F182-IGE LIMA BEAN: Lima Bean IgE: <0.1 kU/L

## 2017-08-22 NOTE — Progress Notes (Deleted)
Office Visit Note  Patient: Tracey LintDolores T Stairs             Date of Birth: Oct 01, 1929           MRN: [id number]             PCP: Vernona Riegerlark, Katherine, MD Referring: Vernona Riegerlark, Katherine, MD Visit Date: 08/31/2017 Occupation: @GUAROCC @    Subjective:  No chief complaint on file.   History of Present Illness: [name] is a 83 y.o. female ***   Activities of Daily Living:  Patient reports morning stiffness for *** {minute/hour:19697}.   Patient {ACTIONS;DENIES/REPORTS:21021675::"Denies"} nocturnal pain.  Difficulty dressing/grooming: {ACTIONS;DENIES/REPORTS:21021675::"Denies"} Difficulty climbing stairs: {ACTIONS;DENIES/REPORTS:21021675::"Denies"} Difficulty getting out of chair: {ACTIONS;DENIES/REPORTS:21021675::"Denies"} Difficulty using hands for taps, buttons, cutlery, and/or writing: {ACTIONS;DENIES/REPORTS:21021675::"Denies"}   No Rheumatology ROS completed.   PMFS History:  Patient Active Problem List   Diagnosis Date Noted  . Food allergy 08/03/2017  . Diarrhea 08/03/2017  . Weight loss 08/03/2017  . Mild persistent asthma 08/03/2017  . Primary osteoarthritis of both hands 02/17/2017  . DDD (degenerative disc disease), cervical 02/17/2017  . Age-related osteoporosis without current pathological fracture 02/17/2017  . Cervical adenopathy 07/24/2015  . Rosacea blepharoconjunctivitis 07/25/2013  . Musculoskeletal pain 07/25/2013  . Seasonal and perennial allergic rhinitis 07/26/2012  . Rhinitis 07/18/2011  . HYPERTENSION 11/25/2007  . COPD mixed type (HCC) 11/25/2007  . ASTHMA 11/19/2007    Past Medical History:  Diagnosis Date  . Asthma   . COPD (chronic obstructive pulmonary disease) (HCC)   . Diverticulosis   . Hypertension   . IBS (irritable bowel syndrome)   . Rosacea     Family History  Problem Relation Age of Onset  . Asthma Unknown   . Arthritis Unknown   . Cancer Unknown    Past Surgical History:  Procedure Laterality Date  .  ADENOIDECTOMY    . APPENDECTOMY    . CHOLECYSTECTOMY    . ORIF ACETABULAR FRACTURE     right leg  . STAPEDECTOMY    . TONSILLECTOMY     Social History   Social History Narrative  . Not on file     Objective: Vital Signs: There were no vitals taken for this visit.   Physical Exam   Musculoskeletal Exam: ***  CDAI Exam: No CDAI exam completed.    Investigation: No additional findings. CBC Latest Ref Rng & Units 08/03/2017 07/23/2015  WBC 3.4 - 10.8 x10E3/uL 7.1 9.5  Hemoglobin 11.1 - 15.9 g/dL 11.912.9 14.712.6  Hematocrit 82.934.0 - 46.6 % 38.5 37.5  Platelets 150 - 379 x10E3/uL 405(H) 315.0   CMP Latest Ref Rng & Units 08/03/2017  Glucose 65 - 99 mg/dL 98  BUN 8 - 27 mg/dL 15  Creatinine 5.620.57 - 1.301.00 mg/dL 8.650.59  Sodium 784134 - 696144 mmol/L 136  Potassium 3.5 - 5.2 mmol/L 4.5  Chloride 96 - 106 mmol/L 97  CO2 20 - 29 mmol/L 26  Calcium 8.7 - 10.3 mg/dL 9.8  Total Protein 6.0 - 8.5 g/dL 7.3  Total Bilirubin 0.0 - 1.2 mg/dL 0.3  Alkaline Phos 39 - 117 IU/L 85  AST 0 - 40 IU/L 21  ALT 0 - 32 IU/L 9    Imaging: No results found.  Speciality Comments: No specialty comments available.    Procedures:  No procedures performed Allergies: Chocolate; Eggs or egg-derived products; Fish allergy; Other; and Penicillins   Assessment / Plan:     Visit Diagnoses: No diagnosis found.    Orders: No orders of  the defined types were placed in this encounter.  No orders of the defined types were placed in this encounter.   Face-to-face time spent with patient was *** minutes. 50% of time was spent in counseling and coordination of care.  Follow-Up Instructions: No Follow-up on file.   Ellen HenriMarissa C Glenn Gullickson, CMA  Note - This record has been created using Animal nutritionistDragon software.  Chart creation errors have been sought, but may not always  have been located. Such creation errors do not reflect on  the standard of medical care.

## 2017-08-31 ENCOUNTER — Ambulatory Visit: Payer: Medicare Other | Admitting: Rheumatology

## 2017-12-07 NOTE — Progress Notes (Addendum)
Office Visit Note  Patient: Tracey LintDolores T Bohlken             Date of Birth: 05/29/1929           MRN: [id number]             PCP: Vernona Riegerlark, Katherine, MD Referring: Vernona Riegerlark, Katherine, MD Visit Date: 12/21/2017 Occupation: @GUAROCC @    Subjective:  Neck pain and lower back pain.   History of Present Illness: [name] is a 84 y.o. female with history of osteoarthritis and DDD.  She states she has been having pain and discomfort in her SI joints.  She is also having pain and stiffness in her neck.  Her hand pain is tolerable currently.  She states she developed gastroenteritis in February which she has resolved now.  Activities of Daily Living:  Patient reports morning stiffness for 0 minute.   Patient Denies nocturnal pain.  Difficulty dressing/grooming: Denies Difficulty climbing stairs: Reports Difficulty getting out of chair: Reports Difficulty using hands for taps, buttons, cutlery, and/or writing: Denies   Review of Systems  Constitutional: Positive for fatigue. Negative for night sweats, weight gain and weight loss.  HENT: Negative for mouth sores, trouble swallowing, trouble swallowing, mouth dryness and nose dryness.   Eyes: Positive for dryness. Negative for pain, redness and visual disturbance.  Respiratory: Positive for shortness of breath. Negative for cough and difficulty breathing.         asthma  Cardiovascular: Negative for chest pain, palpitations, hypertension, irregular heartbeat and swelling in legs/feet.  Gastrointestinal: Negative for blood in stool, constipation and diarrhea.  Endocrine: Negative for increased urination.  Genitourinary: Negative for vaginal dryness.  Musculoskeletal: Positive for arthralgias, joint pain and morning stiffness. Negative for joint swelling, myalgias, muscle weakness, muscle tenderness and myalgias.  Skin: Negative for color change, rash, hair loss, skin tightness, ulcers and sensitivity to sunlight.    Allergic/Immunologic: Negative for susceptible to infections.  Neurological: Negative for dizziness, memory loss, night sweats and weakness.  Hematological: Negative for swollen glands.  Psychiatric/Behavioral: Negative for depressed mood and sleep disturbance. The patient is nervous/anxious.     PMFS History:  Patient Active Problem List   Diagnosis Date Noted  . Food allergy 08/03/2017  . Diarrhea 08/03/2017  . Weight loss 08/03/2017  . Mild persistent asthma 08/03/2017  . Primary osteoarthritis of both hands 02/17/2017  . DDD (degenerative disc disease), cervical 02/17/2017  . Age-related osteoporosis without current pathological fracture 02/17/2017  . Cervical adenopathy 07/24/2015  . Rosacea blepharoconjunctivitis 07/25/2013  . Musculoskeletal pain 07/25/2013  . Seasonal and perennial allergic rhinitis 07/26/2012  . Rhinitis 07/18/2011  . HYPERTENSION 11/25/2007  . COPD mixed type (HCC) 11/25/2007  . ASTHMA 11/19/2007    Past Medical History:  Diagnosis Date  . Asthma   . COPD (chronic obstructive pulmonary disease) (HCC)   . Diverticulosis   . Hypertension   . IBS (irritable bowel syndrome)   . Rosacea     Family History  Problem Relation Age of Onset  . Asthma Unknown   . Arthritis Unknown   . Cancer Unknown    Past Surgical History:  Procedure Laterality Date  . ADENOIDECTOMY    . APPENDECTOMY    . CHOLECYSTECTOMY    . ORIF ACETABULAR FRACTURE     right leg  . STAPEDECTOMY    . TONSILLECTOMY     Social History   Social History Narrative  . Not on file     Objective: Vital Signs: BP 118/65 (  BP Location: Left Arm, Patient Position: Sitting, Cuff Size: Normal)   Pulse 62   Resp 15   Ht 5\' 4"  (1.626 m)   Wt 123 lb 8 oz (56 kg)   BMI 21.20 kg/m    Physical Exam  Constitutional: She is oriented to person, place, and time. She appears well-developed and well-nourished.  HENT:  Head: Normocephalic and atraumatic.  Eyes: Conjunctivae and EOM are  normal.  Neck: Normal range of motion.  Cardiovascular: Normal rate, regular rhythm, normal heart sounds and intact distal pulses.  Pulmonary/Chest: Effort normal and breath sounds normal.  Abdominal: Soft. Bowel sounds are normal.  Lymphadenopathy:    She has no cervical adenopathy.  Neurological: She is alert and oriented to person, place, and time.  Skin: Skin is warm and dry. Capillary refill takes less than 2 seconds.  Psychiatric: She has a normal mood and affect. Her behavior is normal.  Nursing note and vitals reviewed.    Musculoskeletal Exam: She has some discomfort range of motion of her C-spine.  She has severe thoracic kyphosis.  Shoulder joints elbow joints wrist joints were in good range of motion.  She has osteoarthritis in her hands with bilateral DIP PIP thickening.  Hip joints knee joints ankles were in good range of motion.  She has tenderness over bilateral SI joints.  She had bilateral trapezius spasm.  CDAI Exam: No CDAI exam completed.    Investigation: No additional findings. CBC Latest Ref Rng & Units 08/03/2017 07/23/2015  WBC 3.4 - 10.8 x10E3/uL 7.1 9.5  Hemoglobin 11.1 - 15.9 g/dL 16.1 09.6  Hematocrit 04.5 - 46.6 % 38.5 37.5  Platelets 150 - 379 x10E3/uL 405(H) 315.0   CMP Latest Ref Rng & Units 08/03/2017  Glucose 65 - 99 mg/dL 98  BUN 8 - 27 mg/dL 15  Creatinine 4.09 - 8.11 mg/dL 9.14  Sodium 782 - 956 mmol/L 136  Potassium 3.5 - 5.2 mmol/L 4.5  Chloride 96 - 106 mmol/L 97  CO2 20 - 29 mmol/L 26  Calcium 8.7 - 10.3 mg/dL 9.8  Total Protein 6.0 - 8.5 g/dL 7.3  Total Bilirubin 0.0 - 1.2 mg/dL 0.3  Alkaline Phos 39 - 117 IU/L 85  AST 0 - 40 IU/L 21  ALT 0 - 32 IU/L 9    Imaging: No results found.  Speciality Comments: No specialty comments available.    Procedures:  Sacroiliac Joint Inj on 12/21/2017 2:40 PM Indications: pain Details: 27 G 1.5 in needle, posterior approach Medications: 40 mg triamcinolone acetonide 40 MG/ML; 1.5 mL  lidocaine 1 % Aspirate: 0 mL Outcome: tolerated well, no immediate complications Procedure, treatment alternatives, risks and benefits explained, specific risks discussed. Consent was given by the patient. Immediately prior to procedure a time out was called to verify the correct patient, procedure, equipment, support staff and site/side marked as required. Patient was prepped and draped in the usual sterile fashion.   Trigger Point Inj Date/Time: 12/21/2017 2:42 PM Performed by: Pollyann Savoy, MD Authorized by: Pollyann Savoy, MD   Consent Given by:  Patient Site marked: the procedure site was marked   Timeout: prior to procedure the correct patient, procedure, and site was verified   Indications:  Muscle spasm and pain Total # of Trigger Points:  1 Location: neck   Needle Size:  27 G Approach:  Dorsal Medications #1:  0.5 mL lidocaine 1 %; 10 mg triamcinolone acetonide 40 MG/ML Patient tolerance:  Patient tolerated the procedure well with no immediate complications  Allergies: Iodinated diagnostic agents; Chocolate; Eggs or egg-derived products; Fish allergy; Other; and Penicillins   Assessment / Plan:     Visit Diagnoses: Primary osteoarthritis of both hands: She continues to have some pain and stiffness in her bilateral hands due to underlying osteoarthritis.  Pain, neck: She has bilateral trapezius spasm.  Per her request left trapezius area was prepped and injected with cortisone as described above.  Chronic SI joint pain: She has been having pain and discomfort in her bilateral SI joints more so on the left side.  Per her request left SI joint was injected with cortisone as described above.  Postural kyphosis of thoracic region: She has significant kyphosis.  She states she has been getting treatment for osteoporosis.  Some stretching exercises were demonstrated in the office.  I have also advised her to get some massage therapy.  Age-related osteoporosis without  current pathological fracture will and I do not see that she is taking any treatment for osteoporosis.  Have advised her to discuss her bone density with her PCP.  My concern is that her kyphosis is getting worse.  DDD (degenerative disc disease), cervical: Chronic pain  History of COPD  History of hypertension: Her blood pressure is well controlled.  History of asthma    Orders: No orders of the defined types were placed in this encounter.  No orders of the defined types were placed in this encounter.   Face-to-face time spent with patient was 30 minutes.  Greater than 50% of time was spent in counseling and coordination of care.  Follow-Up Instructions: Return in about 1 month (around 01/21/2018) for Osteoarthritis,DDD, Osteoporosis.   Pollyann Savoy, MD  Note - This record has been created using Animal nutritionist.  Chart creation errors have been sought, but may not always  have been located. Such creation errors do not reflect on  the standard of medical care.

## 2017-12-21 ENCOUNTER — Ambulatory Visit (INDEPENDENT_AMBULATORY_CARE_PROVIDER_SITE_OTHER): Payer: Medicare Other | Admitting: Rheumatology

## 2017-12-21 ENCOUNTER — Encounter: Payer: Self-pay | Admitting: Rheumatology

## 2017-12-21 VITALS — BP 118/65 | HR 62 | Resp 15 | Ht 64.0 in | Wt 123.5 lb

## 2017-12-21 DIAGNOSIS — M542 Cervicalgia: Secondary | ICD-10-CM | POA: Diagnosis not present

## 2017-12-21 DIAGNOSIS — M810 Age-related osteoporosis without current pathological fracture: Secondary | ICD-10-CM | POA: Diagnosis not present

## 2017-12-21 DIAGNOSIS — M19041 Primary osteoarthritis, right hand: Secondary | ICD-10-CM | POA: Diagnosis not present

## 2017-12-21 DIAGNOSIS — M19042 Primary osteoarthritis, left hand: Secondary | ICD-10-CM

## 2017-12-21 DIAGNOSIS — M533 Sacrococcygeal disorders, not elsewhere classified: Secondary | ICD-10-CM | POA: Diagnosis not present

## 2017-12-21 DIAGNOSIS — Z8679 Personal history of other diseases of the circulatory system: Secondary | ICD-10-CM

## 2017-12-21 DIAGNOSIS — G8929 Other chronic pain: Secondary | ICD-10-CM | POA: Diagnosis not present

## 2017-12-21 DIAGNOSIS — M4004 Postural kyphosis, thoracic region: Secondary | ICD-10-CM

## 2017-12-21 DIAGNOSIS — M5030 Other cervical disc degeneration, unspecified cervical region: Secondary | ICD-10-CM | POA: Diagnosis not present

## 2017-12-21 DIAGNOSIS — Z8709 Personal history of other diseases of the respiratory system: Secondary | ICD-10-CM | POA: Diagnosis not present

## 2017-12-21 MED ORDER — LIDOCAINE HCL 1 % IJ SOLN
0.5000 mL | INTRAMUSCULAR | Status: AC | PRN
  Administered 2017-12-21: .5 mL

## 2017-12-21 MED ORDER — TRIAMCINOLONE ACETONIDE 40 MG/ML IJ SUSP
40.0000 mg | INTRAMUSCULAR | Status: AC | PRN
  Administered 2017-12-21: 40 mg via INTRA_ARTICULAR

## 2017-12-21 MED ORDER — LIDOCAINE HCL 1 % IJ SOLN
1.5000 mL | INTRAMUSCULAR | Status: AC | PRN
  Administered 2017-12-21: 1.5 mL

## 2017-12-21 MED ORDER — TRIAMCINOLONE ACETONIDE 40 MG/ML IJ SUSP
10.0000 mg | INTRAMUSCULAR | Status: AC | PRN
  Administered 2017-12-21: 10 mg via INTRAMUSCULAR

## 2018-01-09 NOTE — Progress Notes (Deleted)
Office Visit Note  Patient: Tracey LintDolores T Calamari             Date of Birth: 1929-03-06           MRN: [id number]             PCP: Vernona Riegerlark, Katherine, MD Referring: Vernona Riegerlark, Katherine, MD Visit Date: 01/23/2018 Occupation: @GUAROCC @    Subjective:  No chief complaint on file.   History of Present Illness: [name] is a 84 y.o. female ***   Activities of Daily Living:  Patient reports morning stiffness for *** {minute/hour:19697}.   Patient {ACTIONS;DENIES/REPORTS:21021675::"Denies"} nocturnal pain.  Difficulty dressing/grooming: {ACTIONS;DENIES/REPORTS:21021675::"Denies"} Difficulty climbing stairs: {ACTIONS;DENIES/REPORTS:21021675::"Denies"} Difficulty getting out of chair: {ACTIONS;DENIES/REPORTS:21021675::"Denies"} Difficulty using hands for taps, buttons, cutlery, and/or writing: {ACTIONS;DENIES/REPORTS:21021675::"Denies"}   No Rheumatology ROS completed.   PMFS History:  Patient Active Problem List   Diagnosis Date Noted  . Chronic SI joint pain 12/21/2017  . Postural kyphosis of thoracic region 12/21/2017  . Food allergy 08/03/2017  . Diarrhea 08/03/2017  . Weight loss 08/03/2017  . Mild persistent asthma 08/03/2017  . Primary osteoarthritis of both hands 02/17/2017  . DDD (degenerative disc disease), cervical 02/17/2017  . Age-related osteoporosis without current pathological fracture 02/17/2017  . Cervical adenopathy 07/24/2015  . Rosacea blepharoconjunctivitis 07/25/2013  . Musculoskeletal pain 07/25/2013  . Seasonal and perennial allergic rhinitis 07/26/2012  . Rhinitis 07/18/2011  . HYPERTENSION 11/25/2007  . COPD mixed type (HCC) 11/25/2007  . ASTHMA 11/19/2007    Past Medical History:  Diagnosis Date  . Asthma   . COPD (chronic obstructive pulmonary disease) (HCC)   . Diverticulosis   . Hypertension   . IBS (irritable bowel syndrome)   . Rosacea     Family History  Problem Relation Age of Onset  . Asthma Unknown   . Arthritis Unknown    . Cancer Unknown    Past Surgical History:  Procedure Laterality Date  . ADENOIDECTOMY    . APPENDECTOMY    . CHOLECYSTECTOMY    . ORIF ACETABULAR FRACTURE     right leg  . STAPEDECTOMY    . TONSILLECTOMY     Social History   Social History Narrative  . Not on file     Objective: Vital Signs: There were no vitals taken for this visit.   Physical Exam   Musculoskeletal Exam: ***  CDAI Exam: No CDAI exam completed.    Investigation: No additional findings. CBC Latest Ref Rng & Units 08/03/2017 07/23/2015  WBC 3.4 - 10.8 x10E3/uL 7.1 9.5  Hemoglobin 11.1 - 15.9 g/dL 40.912.9 81.112.6  Hematocrit 91.434.0 - 46.6 % 38.5 37.5  Platelets 150 - 379 x10E3/uL 405(H) 315.0   CMP Latest Ref Rng & Units 08/03/2017  Glucose 65 - 99 mg/dL 98  BUN 8 - 27 mg/dL 15  Creatinine 7.820.57 - 9.561.00 mg/dL 2.130.59  Sodium 086134 - 578144 mmol/L 136  Potassium 3.5 - 5.2 mmol/L 4.5  Chloride 96 - 106 mmol/L 97  CO2 20 - 29 mmol/L 26  Calcium 8.7 - 10.3 mg/dL 9.8  Total Protein 6.0 - 8.5 g/dL 7.3  Total Bilirubin 0.0 - 1.2 mg/dL 0.3  Alkaline Phos 39 - 117 IU/L 85  AST 0 - 40 IU/L 21  ALT 0 - 32 IU/L 9    Imaging: No results found.  Speciality Comments: No specialty comments available.    Procedures:  No procedures performed Allergies: Iodinated diagnostic agents; Chocolate; Eggs or egg-derived products; Fish allergy; Other; and Penicillins   Assessment /  Plan:     Visit Diagnoses: Primary osteoarthritis of both hands  DDD (degenerative disc disease), cervical  Age-related osteoporosis without current pathological fracture  Postural kyphosis of thoracic region  Rosacea blepharoconjunctivitis  Chronic SI joint pain  History of asthma  History of COPD  History of hypertension  History of diverticulosis    Orders: No orders of the defined types were placed in this encounter.  No orders of the defined types were placed in this encounter.   Face-to-face time spent with patient was  *** minutes. 50% of time was spent in counseling and coordination of care.  Follow-Up Instructions: No follow-ups on file.   Gearldine Bienenstock, PA-C  Note - This record has been created using Dragon software.  Chart creation errors have been sought, but may not always  have been located. Such creation errors do not reflect on  the standard of medical care.

## 2018-01-23 ENCOUNTER — Ambulatory Visit: Payer: Medicare Other | Admitting: Physician Assistant

## 2018-01-30 ENCOUNTER — Ambulatory Visit: Payer: Medicare Other | Admitting: Rheumatology

## 2018-12-06 NOTE — Progress Notes (Signed)
Office Visit Note  Patient: [name]             Date of Birth: 1929-01-17           MRN: [id number]             PCP: Vernona Rieger, MD Referring: Vernona Rieger, MD Visit Date: 12/07/2018 Occupation: @GUAROCC @  Subjective:  Bilateral SI joint pain   History of Present Illness: [name] is a 85 y.o. female with history of osteoarthritis, DDD, chronic SI joint pain.  She presents today with bilateral SI joint pain that started 1 week ago.  She has been having progressively worsening discomfort and pain at night.  She has difficulty turning over at night.  She would like injections bilaterally.   She has occasional discomfort in both knee joints. She denies any joint swelling.    Activities of Daily Living:  Patient reports morning stiffness for 5-10 minutes.   Patient Reports nocturnal pain.  Difficulty dressing/grooming: Denies Difficulty climbing stairs: Denies Difficulty getting out of chair: Reports Difficulty using hands for taps, buttons, cutlery, and/or writing: Reports  Review of Systems  Constitutional: Positive for fatigue.  HENT: Positive for mouth dryness. Negative for mouth sores and nose dryness.   Eyes: Negative for pain, itching, visual disturbance and dryness.  Respiratory: Negative for cough, hemoptysis, shortness of breath, wheezing and difficulty breathing.   Cardiovascular: Negative for chest pain, palpitations, hypertension and swelling in legs/feet.  Gastrointestinal: Positive for diarrhea. Negative for abdominal pain, blood in stool and constipation.  Endocrine: Negative for increased urination.  Genitourinary: Negative for painful urination.  Musculoskeletal: Positive for arthralgias, joint pain and morning stiffness. Negative for joint swelling, myalgias, muscle weakness, muscle tenderness and myalgias.  Skin: Negative for color change, pallor, rash, hair loss, nodules/bumps, redness, skin tightness, ulcers and sensitivity to  sunlight.  Allergic/Immunologic: Negative for susceptible to infections.  Neurological: Negative for dizziness, light-headedness, numbness, headaches and weakness.  Hematological: Negative for bruising/bleeding tendency and swollen glands.  Psychiatric/Behavioral: Negative for depressed mood and sleep disturbance. The patient is not nervous/anxious.     PMFS History:  Patient Active Problem List   Diagnosis Date Noted  . Chronic SI joint pain 12/21/2017  . Postural kyphosis of thoracic region 12/21/2017  . Food allergy 08/03/2017  . Diarrhea 08/03/2017  . Weight loss 08/03/2017  . Mild persistent asthma 08/03/2017  . Primary osteoarthritis of both hands 02/17/2017  . DDD (degenerative disc disease), cervical 02/17/2017  . Age-related osteoporosis without current pathological fracture 02/17/2017  . Cervical adenopathy 07/24/2015  . Rosacea blepharoconjunctivitis 07/25/2013  . Musculoskeletal pain 07/25/2013  . Seasonal and perennial allergic rhinitis 07/26/2012  . Rhinitis 07/18/2011  . HYPERTENSION 11/25/2007  . COPD mixed type (HCC) 11/25/2007  . ASTHMA 11/19/2007    Past Medical History:  Diagnosis Date  . Asthma   . COPD (chronic obstructive pulmonary disease) (HCC)   . Diverticulosis   . Hypertension   . IBS (irritable bowel syndrome)   . Rosacea     Family History  Problem Relation Age of Onset  . Asthma Other   . Arthritis Other   . Cancer Other    Past Surgical History:  Procedure Laterality Date  . ADENOIDECTOMY    . APPENDECTOMY    . CHOLECYSTECTOMY    . ORIF ACETABULAR FRACTURE     right leg  . STAPEDECTOMY    . TONSILLECTOMY     Social History   Social History Narrative  . Not  on file   Immunization History  Administered Date(s) Administered  . Influenza Split 07/18/2011, 07/17/2012  . Influenza Whole 07/20/2009, 07/19/2010  . Influenza, High Dose Seasonal PF 07/10/2013  . Influenza,inj,Quad PF,6+ Mos 07/10/2014, 07/23/2015  . Pneumococcal  Conjugate-13 08/22/2014  . Pneumococcal Polysaccharide-23 10/03/1997     Objective: Vital Signs: BP 138/69 (BP Location: Left Arm, Patient Position: Sitting, Cuff Size: Normal)   Pulse 60   Resp 14   Ht 5\' 3"  (1.6 m)   Wt 129 lb (58.5 kg)   BMI 22.85 kg/m    Physical Exam Vitals signs and nursing note reviewed.  Constitutional:      Appearance: She is well-developed.  HENT:     Head: Normocephalic and atraumatic.  Eyes:     Conjunctiva/sclera: Conjunctivae normal.  Neck:     Musculoskeletal: Normal range of motion.  Cardiovascular:     Rate and Rhythm: Normal rate and regular rhythm.     Heart sounds: Normal heart sounds.  Pulmonary:     Effort: Pulmonary effort is normal.     Breath sounds: Normal breath sounds.  Abdominal:     General: Bowel sounds are normal.     Palpations: Abdomen is soft.  Lymphadenopathy:     Cervical: No cervical adenopathy.  Skin:    General: Skin is warm and dry.     Capillary Refill: Capillary refill takes less than 2 seconds.  Neurological:     Mental Status: She is alert and oriented to person, place, and time.  Psychiatric:        Behavior: Behavior normal.      Musculoskeletal Exam: C-spine good ROM with no discomfort. Thoracic kyphosis noted. Shoulder joints, elbow joints, wrist joints, MCPs, PIPs, and DIPs good ROM with no synovitis.  Synovial thickening of both CMC joints.  PIP and DIP synovial thickening. incomplete fist formation. Hip joints, knee joints, ankle joints, MTPs, PIPs, and DIPs good ROM with no synovitis.  Pedal edema bilaterally.   CDAI Exam: CDAI Score: Not documented Patient Global Assessment: Not documented; Provider Global Assessment: Not documented Swollen: Not documented; Tender: Not documented Joint Exam   Not documented   There is currently no information documented on the homunculus. Go to the Rheumatology activity and complete the homunculus joint exam.  Investigation: No additional  findings.  Imaging: No results found.  Recent Labs: Lab Results  Component Value Date   WBC 7.1 08/03/2017   HGB 12.9 08/03/2017   PLT 405 (H) 08/03/2017   NA 136 08/03/2017   K 4.5 08/03/2017   CL 97 08/03/2017   CO2 26 08/03/2017   GLUCOSE 98 08/03/2017   BUN 15 08/03/2017   CREATININE 0.59 08/03/2017   BILITOT 0.3 08/03/2017   ALKPHOS 85 08/03/2017   AST 21 08/03/2017   ALT 9 08/03/2017   PROT 7.3 08/03/2017   ALBUMIN 4.4 08/03/2017   CALCIUM 9.8 08/03/2017   GFRAA 96 08/03/2017    Speciality Comments: No specialty comments available.  Procedures:  Sacroiliac Joint Inj on 12/07/2018 11:28 AM Indications: pain Details: 27 G 1.5 in needle, posterior approach Medications (Right): 1 mL lidocaine 1 %; 40 mg triamcinolone acetonide 40 MG/ML Aspirate (Right): 0 mL Medications (Left): 1 mL lidocaine 1 %; 40 mg triamcinolone acetonide 40 MG/ML Aspirate (Left): 0 mL Outcome: tolerated well, no immediate complications Procedure, treatment alternatives, risks and benefits explained, specific risks discussed. Consent was given by the patient. Immediately prior to procedure a time out was called to verify the correct patient,  procedure, equipment, support staff and site/side marked as required. Patient was prepped and draped in the usual sterile fashion.     Allergies: Iodinated diagnostic agents; Chocolate; Eggs or egg-derived products; Fish allergy; Other; and Penicillins   Assessment / Plan:     Visit Diagnoses: Primary osteoarthritis of both hands: She has PIP and DIP synovial thickening consistent with osteoarthritis.  CMC joint synovial thickening bilaterally.  No synovitis noted.  Joint protection and muscle strengthening were discussed.   Chronic SI joint pain - She presents today with bilateral SI joint pain that started 1 week ago.  She has been having severe pain if standing for prolonged periods of time and at night.  She has been having interrupted sleep at night due  to the discomfort.  She requested bilateral cortisone injections.  She tolerated the procedure well.  Procedure note completed.  Aftercare discussed.  She was advised to monitor blood pressure closely following the injection.   Postural kyphosis of thoracic region: She has no midline spinal tenderness at this time.   Age-related osteoporosis without current pathological fracture: She was previously on Fosamax.  She is taking a calcium and vitamin D supplement.   DDD (degenerative disc disease), cervical: She has good ROM with no discomfort.  No symptoms of radiculopathy at this time.   History of hypertension: She was advised to monitor blood pressure closely following the injections today.   Other medical conditions are listed as follows:   History of asthma  History of COPD    Orders: Orders Placed This Encounter  Procedures  . Sacroiliac Joint Inj   No orders of the defined types were placed in this encounter.     Follow-Up Instructions: Return if symptoms worsen or fail to improve, for Osteoarthritis.   Gearldine Bienenstock, PA-C   I examined and evaluated the patient with Sherron Ales PA.  Patient continues to have some discomfort in her joints due to underlying osteoarthritis.  She states her SI joint pain has come back.  She has been having nocturnal pain and difficulty walking.  She requests cortisone injection to bilateral SI joints.  Indications side effects contraindications and different treatment options were discussed.  We decided to inject bilateral SI joints with cortisone as described above.  She tolerated the procedure well.  The plan of care was discussed as noted above.  Pollyann Savoy, MD  Note - This record has been created using Animal nutritionist.  Chart creation errors have been sought, but may not always  have been located. Such creation errors do not reflect on  the standard of medical care.

## 2018-12-07 ENCOUNTER — Encounter: Payer: Self-pay | Admitting: Rheumatology

## 2018-12-07 ENCOUNTER — Ambulatory Visit (INDEPENDENT_AMBULATORY_CARE_PROVIDER_SITE_OTHER): Payer: Medicare Other | Admitting: Rheumatology

## 2018-12-07 VITALS — BP 138/69 | HR 60 | Resp 14 | Ht 63.0 in | Wt 129.0 lb

## 2018-12-07 DIAGNOSIS — M4004 Postural kyphosis, thoracic region: Secondary | ICD-10-CM | POA: Diagnosis not present

## 2018-12-07 DIAGNOSIS — G8929 Other chronic pain: Secondary | ICD-10-CM

## 2018-12-07 DIAGNOSIS — M810 Age-related osteoporosis without current pathological fracture: Secondary | ICD-10-CM | POA: Diagnosis not present

## 2018-12-07 DIAGNOSIS — M533 Sacrococcygeal disorders, not elsewhere classified: Secondary | ICD-10-CM | POA: Diagnosis not present

## 2018-12-07 DIAGNOSIS — M19042 Primary osteoarthritis, left hand: Secondary | ICD-10-CM

## 2018-12-07 DIAGNOSIS — Z8709 Personal history of other diseases of the respiratory system: Secondary | ICD-10-CM

## 2018-12-07 DIAGNOSIS — Z8679 Personal history of other diseases of the circulatory system: Secondary | ICD-10-CM

## 2018-12-07 DIAGNOSIS — M5030 Other cervical disc degeneration, unspecified cervical region: Secondary | ICD-10-CM

## 2018-12-07 DIAGNOSIS — M19041 Primary osteoarthritis, right hand: Secondary | ICD-10-CM

## 2018-12-07 MED ORDER — LIDOCAINE HCL 1 % IJ SOLN
1.0000 mL | INTRAMUSCULAR | Status: AC | PRN
  Administered 2018-12-07: 1 mL

## 2018-12-07 MED ORDER — TRIAMCINOLONE ACETONIDE 40 MG/ML IJ SUSP
40.0000 mg | INTRAMUSCULAR | Status: AC | PRN
  Administered 2018-12-07: 40 mg via INTRA_ARTICULAR

## 2019-04-08 NOTE — Progress Notes (Signed)
Office Visit Note  Patient: [name]             Date of Birth: December 25, 1928           MRN: [id number]             PCP: Alma Friendly, MD Referring: Alma Friendly, MD Visit Date: 04/10/2019 Occupation: @GUAROCC @  Subjective:  Lower back pain and right knee pain.   History of Present Illness: [name] is a 85 y.o. female with history of osteoarthritis, DDD, chronic SI joint pain.She is having SI joint pain that had a gradual onset of several weeks which is a recurrent problem.  It causes difficulty sleeping. She is requesting cortisone injection. She has discomfort in her neck and between shoulders which she describes a throbbing dull ache. She has pain her in right knee for the last few weeks.  There is no history of injury.  She denies any joint swelling.  She can not remember when her last bone density exam was.  Activities of Daily Living:  Patient reports morning stiffness for 5  minutes.   Patient Reports nocturnal pain.  Difficulty dressing/grooming: Denies Difficulty climbing stairs: Reports Difficulty getting out of chair: Reports Difficulty using hands for taps, buttons, cutlery, and/or writing: Reports  Review of Systems  Constitutional: Positive for fatigue. Negative for night sweats, weight gain and weight loss.  HENT: Negative for mouth sores, trouble swallowing, trouble swallowing, mouth dryness and nose dryness.   Eyes: Positive for dryness. Negative for pain, redness and visual disturbance.  Respiratory: Positive for cough (chronic; attributes to seasonal allergies). Negative for shortness of breath and difficulty breathing.   Cardiovascular: Negative for chest pain, palpitations, hypertension, irregular heartbeat and swelling in legs/feet.  Gastrointestinal: Positive for constipation and diarrhea. Negative for abdominal pain and blood in stool.  Endocrine: Negative for increased urination.  Genitourinary: Negative for vaginal dryness.   Musculoskeletal: Positive for arthralgias, joint pain, morning stiffness and muscle tenderness. Negative for joint swelling, myalgias, muscle weakness and myalgias.  Skin: Negative for color change, rash, hair loss, skin tightness, ulcers and sensitivity to sunlight.  Allergic/Immunologic: Negative for susceptible to infections.  Neurological: Negative for dizziness, headaches, memory loss, night sweats and weakness.  Hematological: Negative for swollen glands.  Psychiatric/Behavioral: Positive for depressed mood and sleep disturbance. The patient is nervous/anxious.     PMFS History:  Patient Active Problem List   Diagnosis Date Noted  . Chronic SI joint pain 12/21/2017  . Postural kyphosis of thoracic region 12/21/2017  . Food allergy 08/03/2017  . Diarrhea 08/03/2017  . Weight loss 08/03/2017  . Mild persistent asthma 08/03/2017  . Primary osteoarthritis of both hands 02/17/2017  . DDD (degenerative disc disease), cervical 02/17/2017  . Age-related osteoporosis without current pathological fracture 02/17/2017  . Cervical adenopathy 07/24/2015  . Rosacea blepharoconjunctivitis 07/25/2013  . Musculoskeletal pain 07/25/2013  . Seasonal and perennial allergic rhinitis 07/26/2012  . Rhinitis 07/18/2011  . HYPERTENSION 11/25/2007  . COPD mixed type ([place]) 11/25/2007  . ASTHMA 11/19/2007    Past Medical History:  Diagnosis Date  . Asthma   . COPD (chronic obstructive pulmonary disease) ([place])   . Diverticulosis   . Hypertension   . IBS (irritable bowel syndrome)   . Rosacea     Family History  Problem Relation Age of Onset  . Asthma Other   . Arthritis Other   . Cancer Other    Past Surgical History:  Procedure Laterality Date  . ADENOIDECTOMY    .  APPENDECTOMY    . CHOLECYSTECTOMY    . ORIF ACETABULAR FRACTURE     right leg  . STAPEDECTOMY    . TONSILLECTOMY     Social History   Social History Narrative  . Not on file   Immunization History  Administered  Date(s) Administered  . Influenza Split 07/18/2011, 07/17/2012  . Influenza Whole 07/20/2009, 07/19/2010  . Influenza, High Dose Seasonal PF 07/10/2013  . Influenza,inj,Quad PF,6+ Mos 07/10/2014, 07/23/2015  . Pneumococcal Conjugate-13 08/22/2014  . Pneumococcal Polysaccharide-23 10/03/1997     Objective: Vital Signs: BP 139/71 (BP Location: Left Arm, Patient Position: Sitting, Cuff Size: Normal)   Pulse 64   Resp 13   Ht 5\' 4"  (1.626 m)   Wt 126 lb (57.2 kg)   BMI 21.63 kg/m    Physical Exam Vitals signs and nursing note reviewed.  Constitutional:      Appearance: She is well-developed.  HENT:     Head: Normocephalic and atraumatic.  Eyes:     Conjunctiva/sclera: Conjunctivae normal.  Neck:     Musculoskeletal: Normal range of motion.  Cardiovascular:     Rate and Rhythm: Normal rate and regular rhythm.     Heart sounds: Normal heart sounds.  Pulmonary:     Effort: Pulmonary effort is normal.     Breath sounds: Normal breath sounds.  Abdominal:     General: Bowel sounds are normal.     Palpations: Abdomen is soft.  Lymphadenopathy:     Cervical: No cervical adenopathy.  Skin:    General: Skin is warm and dry.     Capillary Refill: Capillary refill takes less than 2 seconds.  Neurological:     Mental Status: She is alert and oriented to person, place, and time.  Psychiatric:        Behavior: Behavior normal.      Musculoskeletal Exam: C-spine good range of motion.  She had bilateral trapezius spasm.  She has severe thoracic kyphosis.  Shoulder joints, elbow joints, wrist joints with good range of motion.  She has bilateral CMC thickening and subluxation.  Bilateral DIP and PIP thickening was noted.  She had tenderness on palpation over right SI joint.  She has discomfort with range of motion of her right knee joint without any warmth swelling or effusion.  Osteoarthritic changes were noted in her bilateral feet.  CDAI Exam: CDAI Score: - Patient Global: -;  Provider Global: - Swollen: -; Tender: - Joint Exam   No joint exam has been documented for this visit   There is currently no information documented on the homunculus. Go to the Rheumatology activity and complete the homunculus joint exam.  Investigation: No additional findings.  Imaging: Xr Knee 3 View Right  Result Date: 04/10/2019 Moderate patellofemoral narrowing was noted.  No chondrocalcinosis was noted.  Mild lateral compartment narrowing was noted. Impression: These findings are consistent with mild osteoarthritis and moderate chondromalacia patella.   Recent Labs: Lab Results  Component Value Date   WBC 7.1 08/03/2017   HGB 12.9 08/03/2017   PLT 405 (H) 08/03/2017   NA 136 08/03/2017   K 4.5 08/03/2017   CL 97 08/03/2017   CO2 26 08/03/2017   GLUCOSE 98 08/03/2017   BUN 15 08/03/2017   CREATININE 0.59 08/03/2017   BILITOT 0.3 08/03/2017   ALKPHOS 85 08/03/2017   AST 21 08/03/2017   ALT 9 08/03/2017   PROT 7.3 08/03/2017   ALBUMIN 4.4 08/03/2017   CALCIUM 9.8 08/03/2017   GFRAA 96  08/03/2017    Speciality Comments: No specialty comments available.  Procedures:  Sacroiliac Joint Inj on 04/10/2019 11:52 AM Indications: pain Details: 27 G 1.5 in needle, posterior approach Medications: 1 mL lidocaine 1 %; 40 mg triamcinolone acetonide 40 MG/ML Aspirate: 0 mL Outcome: tolerated well, no immediate complications Procedure, treatment alternatives, risks and benefits explained, specific risks discussed. Consent was given by the patient. Immediately prior to procedure a time out was called to verify the correct patient, procedure, equipment, support staff and site/side marked as required. Patient was prepped and draped in the usual sterile fashion.     Allergies: Iodinated diagnostic agents, Chocolate, Eggs or egg-derived products, Fish allergy, Other, and Penicillins   Assessment / Plan:     Visit Diagnoses:  1. Primary osteoarthritis of both hands -she has DIP  and PIP thickening and some chronic discomfort in her hands due to underlying osteoarthritis.  Joint protection was discussed.   2. Chronic SI joint pain -she has chronic SI joint pain.  She has been having increased discomfort in her right SI joint.  Per her request right SI joint was injected with cortisone.  Have given her a handout on some back exercises.  I have also given her prescription for TENS unit that may be helpful to relieve some of the lower back discomfort.   3. Acute pain of right knee -she has been having discomfort in her right knee recently.  There is no history of injury.  No warmth swelling or effusion was noted.  X-rays of the knee joint showed mild to moderate osteoarthritis and moderate chondromalacia patella.  A handout on knee exercises was given.  If she has persistent discomfort she should notify us.   4. DDD (degenerative disc disease), cervical -she has chronic discomfort in her cervical spine and trapezius area.  Application of TENS will be helpful.   5. Postural kyphosis of thoracic region -posture correction was discussed.  I do not have bone density results.   6. Age-related osteoporosis without current pathological fracture-she was advised to get repeat bone density with her PCP.  She was treated with Fosamax in the past.  She has not been taking any medicines currently.   7. History of asthma   8. History of COPD   9. History of hypertension -her blood pressure is controlled.     Orders: Orders Placed This Encounter  Procedures  . Sacroiliac Joint Inj  . XR KNEE 3 VIEW RIGHT   No orders of the defined types were placed in this encounter.   Face-to-face time spent with patient was 30 minutes. Greater than 50% of time was spent in counseling and coordination of care.  Follow-Up Instructions: Return in about 6 months (around 10/11/2019) for Osteoarthritis.   Pollyann SavoyShaili Bradyn Soward, MD  Note - This record has been created using Animal nutritionistDragon software.  Chart  creation errors have been sought, but may not always  have been located. Such creation errors do not reflect on  the standard of medical care.

## 2019-04-10 ENCOUNTER — Ambulatory Visit (INDEPENDENT_AMBULATORY_CARE_PROVIDER_SITE_OTHER): Payer: Medicare Other | Admitting: Rheumatology

## 2019-04-10 ENCOUNTER — Encounter: Payer: Self-pay | Admitting: Rheumatology

## 2019-04-10 ENCOUNTER — Ambulatory Visit (INDEPENDENT_AMBULATORY_CARE_PROVIDER_SITE_OTHER): Payer: Medicare Other

## 2019-04-10 ENCOUNTER — Other Ambulatory Visit: Payer: Self-pay

## 2019-04-10 VITALS — BP 139/71 | HR 64 | Resp 13 | Ht 64.0 in | Wt 126.0 lb

## 2019-04-10 DIAGNOSIS — M4004 Postural kyphosis, thoracic region: Secondary | ICD-10-CM

## 2019-04-10 DIAGNOSIS — M533 Sacrococcygeal disorders, not elsewhere classified: Secondary | ICD-10-CM

## 2019-04-10 DIAGNOSIS — M5030 Other cervical disc degeneration, unspecified cervical region: Secondary | ICD-10-CM

## 2019-04-10 DIAGNOSIS — M19042 Primary osteoarthritis, left hand: Secondary | ICD-10-CM | POA: Diagnosis not present

## 2019-04-10 DIAGNOSIS — M25561 Pain in right knee: Secondary | ICD-10-CM

## 2019-04-10 DIAGNOSIS — G8929 Other chronic pain: Secondary | ICD-10-CM

## 2019-04-10 DIAGNOSIS — Z8709 Personal history of other diseases of the respiratory system: Secondary | ICD-10-CM

## 2019-04-10 DIAGNOSIS — M19041 Primary osteoarthritis, right hand: Secondary | ICD-10-CM | POA: Diagnosis not present

## 2019-04-10 DIAGNOSIS — M810 Age-related osteoporosis without current pathological fracture: Secondary | ICD-10-CM

## 2019-04-10 DIAGNOSIS — Z8679 Personal history of other diseases of the circulatory system: Secondary | ICD-10-CM

## 2019-04-10 MED ORDER — LIDOCAINE HCL 1 % IJ SOLN
1.0000 mL | INTRAMUSCULAR | Status: AC | PRN
  Administered 2019-04-10: 1 mL

## 2019-04-10 MED ORDER — TRIAMCINOLONE ACETONIDE 40 MG/ML IJ SUSP
40.0000 mg | INTRAMUSCULAR | Status: AC | PRN
  Administered 2019-04-10: 40 mg via INTRA_ARTICULAR

## 2019-04-10 NOTE — Patient Instructions (Addendum)
Please discuss osteoporosis treatment and scheduling a bone density exam with primary care provider.  Journal for Nurse Practitioners, 15(4), [phone]. Retrieved July 09, 2018 from [email or web address]  Knee Exercises Ask your health care provider which exercises are safe for you. Do exercises exactly as told by your health care provider and adjust them as directed. It is normal to feel mild stretching, pulling, tightness, or discomfort as you do these exercises. Stop right away if you feel sudden pain or your pain gets worse. Do not begin these exercises until told by your health care provider. Stretching and range-of-motion exercises These exercises warm up your muscles and joints and improve the movement and flexibility of your knee. These exercises also help to relieve pain and swelling. Knee extension, prone 1. Lie on your abdomen (prone position) on a bed. 2. Place your left / right knee just beyond the edge of the surface so your knee is not on the bed. You can put a towel under your left / right thigh just above your kneecap for comfort. 3. Relax your leg muscles and allow gravity to straighten your knee (extension). You should feel a stretch behind your left / right knee. 4. Hold this position for __________ seconds. 5. Scoot up so your knee is supported between repetitions. Repeat __________ times. Complete this exercise __________ times a day. Knee flexion, active  1. Lie on your back with both legs straight. If this causes back discomfort, bend your left / right knee so your foot is flat on the floor. 2. Slowly slide your left / right heel back toward your buttocks. Stop when you feel a gentle stretch in the front of your knee or thigh (flexion). 3. Hold this position for __________ seconds. 4. Slowly slide your left / right heel back to the starting position. Repeat __________ times. Complete this exercise __________ times a day. Quadriceps stretch, prone  1. Lie on  your abdomen on a firm surface, such as a bed or padded floor. 2. Bend your left / right knee and hold your ankle. If you cannot reach your ankle or pant leg, loop a belt around your foot and grab the belt instead. 3. Gently pull your heel toward your buttocks. Your knee should not slide out to the side. You should feel a stretch in the front of your thigh and knee (quadriceps). 4. Hold this position for __________ seconds. Repeat __________ times. Complete this exercise __________ times a day. Hamstring, supine 1. Lie on your back (supine position). 2. Loop a belt or towel over the ball of your left / right foot. The ball of your foot is on the walking surface, right under your toes. 3. Straighten your left / right knee and slowly pull on the belt to raise your leg until you feel a gentle stretch behind your knee (hamstring). ? Do not let your knee bend while you do this. ? Keep your other leg flat on the floor. 4. Hold this position for __________ seconds. Repeat __________ times. Complete this exercise __________ times a day. Strengthening exercises These exercises build strength and endurance in your knee. Endurance is the ability to use your muscles for a long time, even after they get tired. Quadriceps, isometric This exercise stretches the muscles in front of your thigh (quadriceps) without moving your knee joint (isometric). 1. Lie on your back with your left / right leg extended and your other knee bent. Put a rolled towel or small pillow under your knee if told by your  health care provider. 2. Slowly tense the muscles in the front of your left / right thigh. You should see your kneecap slide up toward your hip or see increased dimpling just above the knee. This motion will push the back of the knee toward the floor. 3. For __________ seconds, hold the muscle as tight as you can without increasing your pain. 4. Relax the muscles slowly and completely. Repeat __________ times. Complete  this exercise __________ times a day. Straight leg raises This exercise stretches the muscles in front of your thigh (quadriceps) and the muscles that move your hips (hip flexors). 1. Lie on your back with your left / right leg extended and your other knee bent. 2. Tense the muscles in the front of your left / right thigh. You should see your kneecap slide up or see increased dimpling just above the knee. Your thigh may even shake a bit. 3. Keep these muscles tight as you raise your leg 4-6 inches (10-15 cm) off the floor. Do not let your knee bend. 4. Hold this position for __________ seconds. 5. Keep these muscles tense as you lower your leg. 6. Relax your muscles slowly and completely after each repetition. Repeat __________ times. Complete this exercise __________ times a day. Hamstring, isometric 1. Lie on your back on a firm surface. 2. Bend your left / right knee about __________ degrees. 3. Dig your left / right heel into the surface as if you are trying to pull it toward your buttocks. Tighten the muscles in the back of your thighs (hamstring) to "dig" as hard as you can without increasing any pain. 4. Hold this position for __________ seconds. 5. Release the tension gradually and allow your muscles to relax completely for __________ seconds after each repetition. Repeat __________ times. Complete this exercise __________ times a day. Hamstring curls If told by your health care provider, do this exercise while wearing ankle weights. Begin with __________ lb weights. Then increase the weight by 1 lb (0.5 kg) increments. Do not wear ankle weights that are more than __________ lb. 1. Lie on your abdomen with your legs straight. 2. Bend your left / right knee as far as you can without feeling pain. Keep your hips flat against the floor. 3. Hold this position for __________ seconds. 4. Slowly lower your leg to the starting position. Repeat __________ times. Complete this exercise  __________ times a day. Squats This exercise strengthens the muscles in front of your thigh and knee (quadriceps). 1. Stand in front of a table, with your feet and knees pointing straight ahead. You may rest your hands on the table for balance but not for support. 2. Slowly bend your knees and lower your hips like you are going to sit in a chair. ? Keep your weight over your heels, not over your toes. ? Keep your lower legs upright so they are parallel with the table legs. ? Do not let your hips go lower than your knees. ? Do not bend lower than told by your health care provider. ? If your knee pain increases, do not bend as low. 3. Hold the squat position for __________ seconds. 4. Slowly push with your legs to return to standing. Do not use your hands to pull yourself to standing. Repeat __________ times. Complete this exercise __________ times a day. Wall slides This exercise strengthens the muscles in front of your thigh and knee (quadriceps). 1. Lean your back against a smooth wall or door, and walk your  feet out 18-24 inches (46-61 cm) from it. 2. Place your feet hip-width apart. 3. Slowly slide down the wall or door until your knees bend __________ degrees. Keep your knees over your heels, not over your toes. Keep your knees in line with your hips. 4. Hold this position for __________ seconds. Repeat __________ times. Complete this exercise __________ times a day. Straight leg raises This exercise strengthens the muscles that rotate the leg at the hip and move it away from your body (hip abductors). 1. Lie on your side with your left / right leg in the top position. Lie so your head, shoulder, knee, and hip line up. You may bend your bottom knee to help you keep your balance. 2. Roll your hips slightly forward so your hips are stacked directly over each other and your left / right knee is facing forward. 3. Leading with your heel, lift your top leg 4-6 inches (10-15 cm). You should  feel the muscles in your outer hip lifting. ? Do not let your foot drift forward. ? Do not let your knee roll toward the ceiling. 4. Hold this position for __________ seconds. 5. Slowly return your leg to the starting position. 6. Let your muscles relax completely after each repetition. Repeat __________ times. Complete this exercise __________ times a day. Straight leg raises This exercise stretches the muscles that move your hips away from the front of the pelvis (hip extensors). 1. Lie on your abdomen on a firm surface. You can put a pillow under your hips if that is more comfortable. 2. Tense the muscles in your buttocks and lift your left / right leg about 4-6 inches (10-15 cm). Keep your knee straight as you lift your leg. 3. Hold this position for __________ seconds. 4. Slowly lower your leg to the starting position. 5. Let your leg relax completely after each repetition. Repeat __________ times. Complete this exercise __________ times a day. This information is not intended to replace advice given to you by your health care provider. Make sure you discuss any questions you have with your health care provider. Document Released: 08/03/2005 Document Revised: 07/10/2018 Document Reviewed: 07/10/2018 [employer] Patient Education  2020 [employer].  Back Exercises The following exercises strengthen the muscles that help to support the trunk and back. They also help to keep the lower back flexible. Doing these exercises can help to prevent back pain or lessen existing pain.  If you have back pain or discomfort, try doing these exercises 2-3 times each day or as told by your health care provider.  As your pain improves, do them once each day, but increase the number of times that you repeat the steps for each exercise (do more repetitions).  To prevent the recurrence of back pain, continue to do these exercises once each day or as told by your health care provider. Do exercises exactly as  told by your health care provider and adjust them as directed. It is normal to feel mild stretching, pulling, tightness, or discomfort as you do these exercises, but you should stop right away if you feel sudden pain or your pain gets worse. Exercises Single knee to chest Repeat these steps 3-5 times for each leg: 1. Lie on your back on a firm bed or the floor with your legs extended. 2. Bring one knee to your chest. Your other leg should stay extended and in contact with the floor. 3. Hold your knee in place by grabbing your knee or thigh with both  hands and hold. 4. Pull on your knee until you feel a gentle stretch in your lower back or buttocks. 5. Hold the stretch for 10-30 seconds. 6. Slowly release and straighten your leg. Pelvic tilt Repeat these steps 5-10 times: 1. Lie on your back on a firm bed or the floor with your legs extended. 2. Bend your knees so they are pointing toward the ceiling and your feet are flat on the floor. 3. Tighten your lower abdominal muscles to press your lower back against the floor. This motion will tilt your pelvis so your tailbone points up toward the ceiling instead of pointing to your feet or the floor. 4. With gentle tension and even breathing, hold this position for 5-10 seconds. Cat-cow Repeat these steps until your lower back becomes more flexible: 1. Get into a hands-and-knees position on a firm surface. Keep your hands under your shoulders, and keep your knees under your hips. You may place padding under your knees for comfort. 2. Let your head hang down toward your chest. Contract your abdominal muscles and point your tailbone toward the floor so your lower back becomes rounded like the back of a cat. 3. Hold this position for 5 seconds. 4. Slowly lift your head, let your abdominal muscles relax and point your tailbone up toward the ceiling so your back forms a sagging arch like the back of a cow. 5. Hold this position for 5  seconds.  Press-ups Repeat these steps 5-10 times: 1. Lie on your abdomen (face-down) on the floor. 2. Place your palms near your head, about shoulder-width apart. 3. Keeping your back as relaxed as possible and keeping your hips on the floor, slowly straighten your arms to raise the top half of your body and lift your shoulders. Do not use your back muscles to raise your upper torso. You may adjust the placement of your hands to make yourself more comfortable. 4. Hold this position for 5 seconds while you keep your back relaxed. 5. Slowly return to lying flat on the floor.  Bridges Repeat these steps 10 times: 1. Lie on your back on a firm surface. 2. Bend your knees so they are pointing toward the ceiling and your feet are flat on the floor. Your arms should be flat at your sides, next to your body. 3. Tighten your buttocks muscles and lift your buttocks off the floor until your waist is at almost the same height as your knees. You should feel the muscles working in your buttocks and the back of your thighs. If you do not feel these muscles, slide your feet 1-2 inches farther away from your buttocks. 4. Hold this position for 3-5 seconds. 5. Slowly lower your hips to the starting position, and allow your buttocks muscles to relax completely. If this exercise is too easy, try doing it with your arms crossed over your chest. Abdominal crunches Repeat these steps 5-10 times: 1. Lie on your back on a firm bed or the floor with your legs extended. 2. Bend your knees so they are pointing toward the ceiling and your feet are flat on the floor. 3. Cross your arms over your chest. 4. Tip your chin slightly toward your chest without bending your neck. 5. Tighten your abdominal muscles and slowly raise your trunk (torso) high enough to lift your shoulder blades a tiny bit off the floor. Avoid raising your torso higher than that because it can put too much stress on your low back and does not help  to  strengthen your abdominal muscles. 6. Slowly return to your starting position. Back lifts Repeat these steps 5-10 times: 1. Lie on your abdomen (face-down) with your arms at your sides, and rest your forehead on the floor. 2. Tighten the muscles in your legs and your buttocks. 3. Slowly lift your chest off the floor while you keep your hips pressed to the floor. Keep the back of your head in line with the curve in your back. Your eyes should be looking at the floor. 4. Hold this position for 3-5 seconds. 5. Slowly return to your starting position. Contact a health care provider if:  Your back pain or discomfort gets much worse when you do an exercise.  Your worsening back pain or discomfort does not lessen within 2 hours after you exercise. If you have any of these problems, stop doing these exercises right away. Do not do them again unless your health care provider says that you can. Get help right away if:  You develop sudden, severe back pain. If this happens, stop doing the exercises right away. Do not do them again unless your health care provider says that you can. This information is not intended to replace advice given to you by your health care provider. Make sure you discuss any questions you have with your health care provider. Document Released: 10/27/2004 Document Revised: 01/24/2019 Document Reviewed: 06/21/2018 [employer] Patient Education  2020 [employer]

## 2019-12-23 NOTE — Progress Notes (Signed)
Office Visit Note  Patient: [name]             Date of Birth: June 29, 1929           MRN: [id number]             PCP: Alma Friendly, MD Referring: Alma Friendly, MD Visit Date: 12/31/2019 Occupation: @GUAROCC @  Subjective:  Pain in both SI joints  History of Present Illness: [name] is a 86 y.o. female with history of osteoarthritis and DDD.  Patient presents today with bilateral SI joint pain which returned 2 months ago.  She had cortisone injections in both SI joints and 04/10/2019 which provided significant relief.  She is been experiencing nocturnal pain and stiffness in her lower back.  She has occasional trapezius muscle tension and muscle tenderness and uses a heating pad and Aspercreme topically for symptomatic relief.  She takes Advil very sparingly for pain relief.  She denies any pain in her hands or knee joints at this time.  She denies any joint swelling.  She continues take calcium and vitamin D supplements on a daily basis.  She has not had a recent DEXA.   Activities of Daily Living:  Patient reports morning stiffness for 2  hours.   Patient Denies nocturnal pain.  Difficulty dressing/grooming: Denies Difficulty climbing stairs: Reports Difficulty getting out of chair: Reports Difficulty using hands for taps, buttons, cutlery, and/or writing: Denies  Review of Systems  Constitutional: Positive for fatigue.  HENT: Positive for mouth dryness. Negative for mouth sores and nose dryness.   Eyes: Positive for dryness. Negative for pain and visual disturbance.  Respiratory: Negative for cough, hemoptysis and difficulty breathing.   Cardiovascular: Positive for swelling in legs/feet. Negative for chest pain, palpitations and hypertension.  Gastrointestinal: Negative for blood in stool, constipation and diarrhea.  Endocrine: Negative for increased urination.  Genitourinary: Negative for difficulty urinating and painful urination.    Musculoskeletal: Positive for arthralgias, joint pain, joint swelling, muscle weakness and morning stiffness. Negative for myalgias, muscle tenderness and myalgias.  Skin: Negative for color change, pallor, rash, hair loss, nodules/bumps, skin tightness, ulcers and sensitivity to sunlight.  Allergic/Immunologic: Negative for susceptible to infections.  Neurological: Negative for dizziness, numbness and headaches.  Hematological: Negative for bruising/bleeding tendency and swollen glands.  Psychiatric/Behavioral: Positive for sleep disturbance. Negative for depressed mood. The patient is not nervous/anxious.     PMFS History:  Patient Active Problem List   Diagnosis Date Noted  . Chronic SI joint pain 12/21/2017  . Postural kyphosis of thoracic region 12/21/2017  . Food allergy 08/03/2017  . Diarrhea 08/03/2017  . Weight loss 08/03/2017  . Mild persistent asthma 08/03/2017  . Primary osteoarthritis of both hands 02/17/2017  . DDD (degenerative disc disease), cervical 02/17/2017  . Age-related osteoporosis without current pathological fracture 02/17/2017  . Cervical adenopathy 07/24/2015  . Rosacea blepharoconjunctivitis 07/25/2013  . Musculoskeletal pain 07/25/2013  . Seasonal and perennial allergic rhinitis 07/26/2012  . Rhinitis 07/18/2011  . HYPERTENSION 11/25/2007  . COPD mixed type ([place]) 11/25/2007  . ASTHMA 11/19/2007    Past Medical History:  Diagnosis Date  . Asthma   . COPD (chronic obstructive pulmonary disease) ([place])   . Diverticulosis   . Hypertension   . IBS (irritable bowel syndrome)   . Rosacea     Family History  Problem Relation Age of Onset  . Asthma Other   . Arthritis Other   . Cancer Other    Past Surgical History:  Procedure Laterality Date  . ADENOIDECTOMY    . APPENDECTOMY    . CHOLECYSTECTOMY    . ORIF ACETABULAR FRACTURE     right leg  . STAPEDECTOMY    . TONSILLECTOMY     Social History   Social History Narrative  . Not on file    Immunization History  Administered Date(s) Administered  . Influenza Split 07/18/2011, 07/17/2012  . Influenza Whole 07/20/2009, 07/19/2010  . Influenza, High Dose Seasonal PF 07/10/2013  . Influenza,inj,Quad PF,6+ Mos 07/10/2014, 07/23/2015  . Pneumococcal Conjugate-13 08/22/2014  . Pneumococcal Polysaccharide-23 10/03/1997     Objective: Vital Signs: BP 128/60 (BP Location: Left Arm, Patient Position: Sitting, Cuff Size: Normal)   Pulse 65   Resp 16   Ht 5\' 4"  (1.626 m)   Wt 126 lb 3.2 oz (57.2 kg)   BMI 21.66 kg/m    Physical Exam Vitals and nursing note reviewed.  Constitutional:      Appearance: She is well-developed.  HENT:     Head: Normocephalic and atraumatic.  Eyes:     Conjunctiva/sclera: Conjunctivae normal.  Pulmonary:     Effort: Pulmonary effort is normal.  Abdominal:     General: Bowel sounds are normal.     Palpations: Abdomen is soft.  Musculoskeletal:     Cervical back: Normal range of motion.  Lymphadenopathy:     Cervical: No cervical adenopathy.  Skin:    General: Skin is warm and dry.     Capillary Refill: Capillary refill takes less than 2 seconds.  Neurological:     Mental Status: She is alert and oriented to person, place, and time.  Psychiatric:        Behavior: Behavior normal.      Musculoskeletal Exam: C-spine limited ROM. Thoracic kyphosis noted.  No midline spinal tenderness.  Tenderness over both SI joints.  Shoulder joints, elbow joints, wrist joints, MCPs, PIPs, and DIPs good ROM with no synovitis.  PIP and DIP thickening consistent with osteoarthritis.  CMC joint prominence bilaterally.  Hip joints good ROM with no discomfort.  Knee joints good ROM with no warmth or effusion.  Ankle joints good ROM with no discomfort.    CDAI Exam: CDAI Score: -- Patient Global: --; Provider Global: -- Swollen: --; Tender: -- Joint Exam 12/31/2019   No joint exam has been documented for this visit   There is currently no information  documented on the homunculus. Go to the Rheumatology activity and complete the homunculus joint exam.  Investigation: No additional findings.  Imaging: No results found.  Recent Labs: Lab Results  Component Value Date   WBC 7.1 08/03/2017   HGB 12.9 08/03/2017   PLT 405 (H) 08/03/2017   NA 136 08/03/2017   K 4.5 08/03/2017   CL 97 08/03/2017   CO2 26 08/03/2017   GLUCOSE 98 08/03/2017   BUN 15 08/03/2017   CREATININE 0.59 08/03/2017   BILITOT 0.3 08/03/2017   ALKPHOS 85 08/03/2017   AST 21 08/03/2017   ALT 9 08/03/2017   PROT 7.3 08/03/2017   ALBUMIN 4.4 08/03/2017   CALCIUM 9.8 08/03/2017   GFRAA 96 08/03/2017    Speciality Comments: No specialty comments available.  Procedures:  Sacroiliac Joint Inj on 12/31/2019 2:56 PM Indications: pain Details: 27 G 1.5 in needle, posterior approach Medications (Right): 1 mL lidocaine 1 %; 40 mg triamcinolone acetonide 40 MG/ML Aspirate (Right): 0 mL Medications (Left): 1 mL lidocaine 1 %; 40 mg triamcinolone acetonide 40 MG/ML Aspirate (Left): 0 mL  Outcome: tolerated well, no immediate complications Procedure, treatment alternatives, risks and benefits explained, specific risks discussed. Consent was given by the patient. Immediately prior to procedure a time out was called to verify the correct patient, procedure, equipment, support staff and site/side marked as required. Patient was prepped and draped in the usual sterile fashion.     Allergies: Iodinated diagnostic agents, Chocolate, Eggs or egg-derived products, Fish allergy, Other, and Penicillins   Assessment / Plan:     Visit Diagnoses: Primary osteoarthritis of both hands: She has PIP and DIP thickening consistent with osteoarthritis of both hands.  She has bilateral CMC joint thickening and prominence noted.  She has a right CMC joint brace which she wears on occasion.  She also uses topical Aspercreme as needed.  Joint protection and muscle strengthening were discussed.   She has no synovitis on exam.  She was advised to notify us if she develops increased joint pain or joint swelling.  She will follow-up in the office in 6 months.  Chronic SI joint pain: She presents today with bilateral SI joint pain.  She is been experiencing increased discomfort for the past 2 months.  She is not experiencing any radiating pain or symptoms of sciatica at this time.  She had SI joint cortisone injections on 04/10/2019 which provided significant pain relief but her symptoms have returned.  She has tenderness to palpation of both SI joints on exam.  She requested bilateral cortisone injections.  She tolerated procedure well.  Procedure notes were completed above.  After care was discussed.  Primary osteoarthritis of right knee - X-rays of the knee joint showed mild to moderate osteoarthritis and moderate chondromalacia patella: She has good ROM of the right knee joint with no discomfort.  No warmth or effusion noted.   DDD (degenerative disc disease), cervical: She has limited ROM on exam.  No symptoms of radiculopathy.  She has intermittent trapezius muscle tension and tenderness bilaterally.  She uses a heating pad and taking advil prn for pain relief.   Postural kyphosis of thoracic region: Thoracic kyphosis noted.  No midline spinal tenderness.   Age-related osteoporosis without current pathological fracture -She was advised to get repeat bone density with her PCP.  She was treated with Fosamax in the past.  She is taking calcium and vitamin D supplements daily.   Other medical conditions are listed as follows:   History of hypertension  History of asthma  History of COPD  Orders: Orders Placed This Encounter  Procedures  . Sacroiliac Joint Inj   No orders of the defined types were placed in this encounter.    Follow-Up Instructions: Return in about 6 months (around 07/02/2020) for Osteoarthritis.   Gearldine Bienenstock, PA-C   I examined and evaluated the patient with  Sherron Ales PA.  Patient had tenderness on palpation of bilateral SI joints on my exam.  Per her request bilateral SI joints were injected as described above.  She tolerated the procedure well.  The plan of care was discussed as noted above.  Pollyann Savoy, MD  Note - This record has been created using Animal nutritionist.  Chart creation errors have been sought, but may not always  have been located. Such creation errors do not reflect on  the standard of medical care.

## 2019-12-31 ENCOUNTER — Ambulatory Visit (INDEPENDENT_AMBULATORY_CARE_PROVIDER_SITE_OTHER): Payer: Medicare Other | Admitting: Physician Assistant

## 2019-12-31 ENCOUNTER — Encounter: Payer: Self-pay | Admitting: Physician Assistant

## 2019-12-31 ENCOUNTER — Other Ambulatory Visit: Payer: Self-pay

## 2019-12-31 VITALS — BP 128/60 | HR 65 | Resp 16 | Ht 64.0 in | Wt 126.2 lb

## 2019-12-31 DIAGNOSIS — Z8709 Personal history of other diseases of the respiratory system: Secondary | ICD-10-CM

## 2019-12-31 DIAGNOSIS — M19041 Primary osteoarthritis, right hand: Secondary | ICD-10-CM

## 2019-12-31 DIAGNOSIS — Z8679 Personal history of other diseases of the circulatory system: Secondary | ICD-10-CM

## 2019-12-31 DIAGNOSIS — M5030 Other cervical disc degeneration, unspecified cervical region: Secondary | ICD-10-CM

## 2019-12-31 DIAGNOSIS — M19042 Primary osteoarthritis, left hand: Secondary | ICD-10-CM

## 2019-12-31 DIAGNOSIS — M533 Sacrococcygeal disorders, not elsewhere classified: Secondary | ICD-10-CM | POA: Diagnosis not present

## 2019-12-31 DIAGNOSIS — M1711 Unilateral primary osteoarthritis, right knee: Secondary | ICD-10-CM | POA: Diagnosis not present

## 2019-12-31 DIAGNOSIS — G8929 Other chronic pain: Secondary | ICD-10-CM

## 2019-12-31 DIAGNOSIS — M810 Age-related osteoporosis without current pathological fracture: Secondary | ICD-10-CM

## 2019-12-31 DIAGNOSIS — M4004 Postural kyphosis, thoracic region: Secondary | ICD-10-CM

## 2019-12-31 MED ORDER — LIDOCAINE HCL 1 % IJ SOLN
1.0000 mL | INTRAMUSCULAR | Status: AC | PRN
  Administered 2019-12-31: 1 mL

## 2019-12-31 MED ORDER — TRIAMCINOLONE ACETONIDE 40 MG/ML IJ SUSP
40.0000 mg | INTRAMUSCULAR | Status: AC | PRN
  Administered 2019-12-31: 40 mg via INTRA_ARTICULAR

## 2020-09-17 ENCOUNTER — Ambulatory Visit: Payer: Medicare Other | Admitting: Rheumatology

## 2020-09-17 NOTE — Progress Notes (Signed)
Office Visit Note  Patient: [name]             Date of Birth: 30-Aug-1929           MRN: [id number]             PCP: Vernona Rieger, MD Referring: Vernona Rieger, MD Visit Date: 09/22/2020 Occupation: @GUAROCC @  Subjective:  Bilateral SI joint pain   History of Present Illness: [name] is a 86 y.o. female with history osteoarthritis, DDD, and osteoporosis. She presents today with increased pain in both SI joints, L>R.  She states she experiences radiating pain down the back of the left leg when rising from a seated to standing position. She uses a heating pad on occasion and taking ibuprofen as needed for pain relief.  She requested bilateral SI joint cortisone injections today.  She denies any knee joint pain at this time.  She has occasional stiffness in both hands.  She uses CBD cream or aspercreme topically as needed for pain relief.  She denies any falls or fractures recently.  She continues to take a calcium and vitamin D supplement daily.  She has noticed some progression in her thoracic kyphosis but denies any thoracic spinal pain at this time.   Activities of Daily Living:  Patient reports morning stiffness for 10 minutes.   Patient Reports nocturnal pain.  Difficulty dressing/grooming: Denies Difficulty climbing stairs: Reports Difficulty getting out of chair: Reports Difficulty using hands for taps, buttons, cutlery, and/or writing: Reports  Review of Systems  Constitutional: Positive for fatigue.  HENT: Positive for mouth dryness. Negative for mouth sores and nose dryness.   Eyes: Positive for dryness. Negative for pain and visual disturbance.  Respiratory: Negative for cough, hemoptysis, shortness of breath and difficulty breathing.   Cardiovascular: Positive for swelling in legs/feet. Negative for chest pain, palpitations and hypertension.  Gastrointestinal: Positive for diarrhea. Negative for blood in stool and constipation.  Endocrine:  Negative for increased urination.  Genitourinary: Negative for difficulty urinating and painful urination.  Musculoskeletal: Positive for arthralgias, gait problem, joint pain, muscle weakness and morning stiffness. Negative for joint swelling, myalgias, muscle tenderness and myalgias.  Skin: Negative for color change, pallor, rash, hair loss, nodules/bumps, skin tightness, ulcers and sensitivity to sunlight.  Allergic/Immunologic: Negative for susceptible to infections.  Neurological: Positive for weakness. Negative for dizziness, numbness and headaches.  Hematological: Negative for bruising/bleeding tendency and swollen glands.  Psychiatric/Behavioral: Positive for sleep disturbance. Negative for depressed mood. The patient is not nervous/anxious.     PMFS History:  Patient Active Problem List   Diagnosis Date Noted  . Chronic SI joint pain 12/21/2017  . Postural kyphosis of thoracic region 12/21/2017  . Food allergy 08/03/2017  . Diarrhea 08/03/2017  . Weight loss 08/03/2017  . Mild persistent asthma 08/03/2017  . Primary osteoarthritis of both hands 02/17/2017  . DDD (degenerative disc disease), cervical 02/17/2017  . Age-related osteoporosis without current pathological fracture 02/17/2017  . Cervical adenopathy 07/24/2015  . Rosacea blepharoconjunctivitis 07/25/2013  . Musculoskeletal pain 07/25/2013  . Seasonal and perennial allergic rhinitis 07/26/2012  . Rhinitis 07/18/2011  . HYPERTENSION 11/25/2007  . COPD mixed type ([facility]) 11/25/2007  . ASTHMA 11/19/2007    Past Medical History:  Diagnosis Date  . Asthma   . COPD (chronic obstructive pulmonary disease) ([facility])   . Diverticulosis   . Hypertension   . IBS (irritable bowel syndrome)   . Rosacea     Family History  Problem Relation Age  of Onset  . Asthma Other   . Arthritis Other   . Cancer Other    Past Surgical History:  Procedure Laterality Date  . ADENOIDECTOMY    . APPENDECTOMY    . CHOLECYSTECTOMY    .  ORIF ACETABULAR FRACTURE     right leg  . STAPEDECTOMY    . TONSILLECTOMY     Social History   Social History Narrative  . Not on file   Immunization History  Administered Date(s) Administered  . Fluad Quad(high Dose 65+) 07/11/2017, 07/13/2018, 07/23/2020  . Influenza Split 07/18/2011, 07/17/2012  . Influenza Whole 07/20/2009, 07/19/2010  . Influenza, High Dose Seasonal PF 07/10/2013, 07/12/2019  . Influenza, Seasonal, Injecte, Preservative Fre 07/10/2014, 07/23/2015  . Influenza,inj,Quad PF,6+ Mos 07/10/2014, 07/23/2015  . PFIZER SARS-COV-2 Vaccination 10/11/2019, 11/01/2019, 07/04/2020  . Pneumococcal Conjugate-13 08/01/2014, 08/22/2014  . Pneumococcal Polysaccharide-23 10/03/1997, 08/06/2003  . Zoster 03/03/2009  . Zoster Recombinat (Shingrix) 08/06/2018     Objective: Vital Signs: BP (!) 143/77 (BP Location: Right Arm, Patient Position: Sitting, Cuff Size: Small)   Pulse 76   Resp 13   Ht 5\' 4"  (1.626 m)   Wt 126 lb 3.2 oz (57.2 kg)   BMI 21.66 kg/m    Physical Exam Vitals and nursing note reviewed.  Constitutional:      Appearance: She is well-developed and well-nourished.  HENT:     Head: Normocephalic and atraumatic.  Eyes:     Extraocular Movements: EOM normal.     Conjunctiva/sclera: Conjunctivae normal.  Cardiovascular:     Pulses: Intact distal pulses.  Pulmonary:     Effort: Pulmonary effort is normal.  Abdominal:     Palpations: Abdomen is soft.  Musculoskeletal:     Cervical back: Normal range of motion.  Skin:    General: Skin is warm and dry.     Capillary Refill: Capillary refill takes less than 2 seconds.  Neurological:     Mental Status: She is alert and oriented to person, place, and time.  Psychiatric:        Mood and Affect: Mood and affect normal.        Behavior: Behavior normal.      Musculoskeletal Exam: C-spine limited ROM with lateral rotation. Thoracic kyphosis noted.  Limited ROM of lumbar spine.  No midline spinal  tenderness.  SI joint tenderness bilaterally.  Shoulder joints, elbow joints, wrist joints, MCPs, PIPs, and DIPs good ROM with no synovitis.  CMC joint thickening bilaterally.  PIP and DIP thickening consistent with osteoarthritis of both hands. Knee joints good ROM with no warmth or effusion.  Pedal edema noted bilaterally.    CDAI Exam: CDAI Score: -- Patient Global: --; Provider Global: -- Swollen: --; Tender: -- Joint Exam 09/22/2020   No joint exam has been documented for this visit   There is currently no information documented on the homunculus. Go to the Rheumatology activity and complete the homunculus joint exam.  Investigation: No additional findings.  Imaging: No results found.  Recent Labs: Lab Results  Component Value Date   WBC 7.1 08/03/2017   HGB 12.9 08/03/2017   PLT 405 (H) 08/03/2017   NA 136 08/03/2017   K 4.5 08/03/2017   CL 97 08/03/2017   CO2 26 08/03/2017   GLUCOSE 98 08/03/2017   BUN 15 08/03/2017   CREATININE 0.59 08/03/2017   BILITOT 0.3 08/03/2017   ALKPHOS 85 08/03/2017   AST 21 08/03/2017   ALT 9 08/03/2017   PROT 7.3 08/03/2017  ALBUMIN 4.4 08/03/2017   CALCIUM 9.8 08/03/2017   GFRAA 96 08/03/2017    Speciality Comments: No specialty comments available.  Procedures:  Sacroiliac Joint Inj on 09/22/2020 2:03 PM Indications: pain Details: 27 G 1.5 in needle, posterior approach Medications (Right): 1 mL lidocaine 1 %; 40 mg triamcinolone acetonide 40 MG/ML Aspirate (Right): 0 mL Medications (Left): 1 mL lidocaine 1 %; 40 mg triamcinolone acetonide 40 MG/ML Aspirate (Left): 0 mL Outcome: tolerated well, no immediate complications Procedure, treatment alternatives, risks and benefits explained, specific risks discussed. Consent was given by the patient. Immediately prior to procedure a time out was called to verify the correct patient, procedure, equipment, support staff and site/side marked as required. Patient was prepped and draped in  the usual sterile fashion.     Allergies: Clindamycin, Iodinated diagnostic agents, Chocolate, Eggs or egg-derived products, Fish allergy, Other, and Penicillins   Assessment / Plan:     Visit Diagnoses: Primary osteoarthritis of both hands: She has PIP and DIP thickening consistent with osteoarthritis of both hands.  CMC joint prominence noted bilaterally.  She experiences intermittent pain and stiffness in both hands but has not noticed any joint swelling.  She was able to make a complete fist bilaterally.  Joint protection and muscle strengthening were discussed.  She was given a handout of hand exercises to perform.  Chronic SI joint pain: She presents today with increased SI joint pain bilaterally.  She has tenderness palpation over both SI joints.  No midline spinal tenderness was noted.  She experiences occasional radiating pain down the posterior aspect of the left lower extremity when rising from a seated position.  She requested bilateral SI joint cortisone injections today.  She tolerated the procedure well.  Procedure note was completed above.  Aftercare was discussed.  She was advised to monitor blood pressure closely.  She was advised to notify us if her discomfort persists or worsens.  Primary osteoarthritis of right knee: She has good range of motion of the right knee joint on exam.  No warmth or effusion was noted.  She has not had any increased right knee joint pain or stiffness recently.  She was given a handout of knee joint exercises to perform.  DDD (degenerative disc disease), cervical: She has slightly limited lateral rotation bilaterally.  No symptoms of radiculopathy or discomfort at this time.  Postural kyphosis of thoracic region: Thoracic kyphosis was noted.  She has no midline spinal tenderness.  We discussed the importance of appropriate posture and stretching exercises.  Age-related osteoporosis without current pathological fracture -DEXA followed by PCP.  She was  treated with Fosamax in the past.  She is taking calcium and vitamin D supplements daily.  She has not had any recent falls or fractures.  Other medical conditions are listed as follows:  History of asthma  History of COPD  History of hypertension: She was advised to monitor her blood pressure closely following the cortisone injections today.  Orders: Orders Placed This Encounter  Procedures  . Sacroiliac Joint Inj   No orders of the defined types were placed in this encounter.     Follow-Up Instructions: Return in about 4 months (around 01/21/2021) for Osteoarthritis, DDD.   Gearldine Bienenstock, PA-C  Note - This record has been created using Dragon software.  Chart creation errors have been sought, but may not always  have been located. Such creation errors do not reflect on  the standard of medical care.

## 2020-09-22 ENCOUNTER — Other Ambulatory Visit: Payer: Self-pay

## 2020-09-22 ENCOUNTER — Encounter: Payer: Self-pay | Admitting: Physician Assistant

## 2020-09-22 ENCOUNTER — Ambulatory Visit (INDEPENDENT_AMBULATORY_CARE_PROVIDER_SITE_OTHER): Payer: Medicare Other | Admitting: Physician Assistant

## 2020-09-22 VITALS — BP 143/77 | HR 76 | Resp 13 | Ht 64.0 in | Wt 126.2 lb

## 2020-09-22 DIAGNOSIS — M19041 Primary osteoarthritis, right hand: Secondary | ICD-10-CM | POA: Diagnosis not present

## 2020-09-22 DIAGNOSIS — M533 Sacrococcygeal disorders, not elsewhere classified: Secondary | ICD-10-CM

## 2020-09-22 DIAGNOSIS — M1711 Unilateral primary osteoarthritis, right knee: Secondary | ICD-10-CM

## 2020-09-22 DIAGNOSIS — M810 Age-related osteoporosis without current pathological fracture: Secondary | ICD-10-CM

## 2020-09-22 DIAGNOSIS — M5030 Other cervical disc degeneration, unspecified cervical region: Secondary | ICD-10-CM

## 2020-09-22 DIAGNOSIS — M4004 Postural kyphosis, thoracic region: Secondary | ICD-10-CM

## 2020-09-22 DIAGNOSIS — Z8709 Personal history of other diseases of the respiratory system: Secondary | ICD-10-CM

## 2020-09-22 DIAGNOSIS — Z8679 Personal history of other diseases of the circulatory system: Secondary | ICD-10-CM

## 2020-09-22 DIAGNOSIS — G8929 Other chronic pain: Secondary | ICD-10-CM

## 2020-09-22 DIAGNOSIS — M19042 Primary osteoarthritis, left hand: Secondary | ICD-10-CM

## 2020-09-22 MED ORDER — TRIAMCINOLONE ACETONIDE 40 MG/ML IJ SUSP
40.0000 mg | INTRAMUSCULAR | Status: AC | PRN
  Administered 2020-09-22: 40 mg via INTRA_ARTICULAR

## 2020-09-22 MED ORDER — LIDOCAINE HCL 1 % IJ SOLN
1.0000 mL | INTRAMUSCULAR | Status: AC | PRN
  Administered 2020-09-22: 1 mL

## 2020-09-22 NOTE — Patient Instructions (Signed)
Sacroiliac Joint Dysfunction  Sacroiliac joint dysfunction is a condition that causes inflammation on one or both sides of the sacroiliac (SI) joint. The SI joint connects the lower part of the spine (sacrum) with the two upper portions of the pelvis (ilium). This condition causes deep aching or burning pain in the low back. In some cases, the pain may also spread into one or both buttocks, hips, or thighs. What are the causes? This condition may be caused by:  Pregnancy. During pregnancy, extra stress is put on the SI joints because the pelvis widens.  Injury, such as: ? Injuries from car accidents. ? Sports-related injuries. ? Work-related injuries.  Having one leg that is shorter than the other.  Conditions that affect the joints, such as: ? Rheumatoid arthritis. ? Gout. ? Psoriatic arthritis. ? Joint infection (septic arthritis). Sometimes, the cause of SI joint dysfunction is not known. What are the signs or symptoms? Symptoms of this condition include:  Aching or burning pain in the lower back. The pain may also spread to other areas, such as: ? Buttocks. ? Groin. ? Thighs.  Muscle spasms in or around the painful areas.  Increased pain when standing, walking, running, stair climbing, bending, or lifting. How is this diagnosed? This condition is diagnosed with a physical exam and medical history. During the exam, the health care provider may move one or both of your legs to different positions to check for pain. Various tests may be done to confirm the diagnosis, including:  Imaging tests to look for other causes of pain. These may include: ? MRI. ? CT scan. ? Bone scan.  Diagnostic injection. A numbing medicine is injected into the SI joint using a needle. If your pain is temporarily improved or stopped after the injection, this can indicate that SI joint dysfunction is the problem. How is this treated? Treatment depends on the cause and severity of your condition.  Treatment options may include:  Ice or heat applied to the lower back area after an injury. This may help reduce pain and muscle spasms.  Medicines to relieve pain or inflammation or to relax the muscles.  Wearing a back brace (sacroiliac brace) to help support the joint while your back is healing.  Physical therapy to increase muscle strength around the joint and flexibility at the joint. This may also involve learning proper body positions and ways of moving to relieve stress on the joint.  Direct manipulation of the SI joint.  Injections of steroid medicine into the joint to reduce pain and swelling.  Radiofrequency ablation to burn away nerves that are carrying pain messages from the joint.  Use of a device that provides electrical stimulation to help reduce pain at the joint.  Surgery to put in screws and plates that limit or prevent joint motion. This is rare. Follow these instructions at home: Medicines  Take over-the-counter and prescription medicines only as told by your health care provider.  Do not drive or use heavy machinery while taking prescription pain medicine.  If you are taking prescription pain medicine, take actions to prevent or treat constipation. Your health care provider may recommend that you: ? Drink enough fluid to keep your urine pale yellow. ? Eat foods that are high in fiber, such as fresh fruits and vegetables, whole grains, and beans. ? Limit foods that are high in fat and processed sugars, such as fried or sweet foods. ? Take an over-the-counter or prescription medicine for constipation. If you have a brace:    Wear the brace as told by your health care provider. Remove it only as told by your health care provider.  Keep the brace clean.  If the brace is not waterproof: ? Do not let it get wet. ? Cover it with a watertight covering when you take a bath or a shower. Managing pain, stiffness, and swelling      Icing can help with pain and  swelling. Heat may help with muscle tension or spasms. Ask your health care provider if you should use ice or heat.  If directed, put ice on the affected area: ? If you have a removable brace, remove it as told by your health care provider. ? Put ice in a plastic bag. ? Place a towel between your skin and the bag. ? Leave the ice on for 20 minutes, 2-3 times a day.  If directed, apply heat to the affected area. Use the heat source that your health care provider recommends, such as a moist heat pack or a heating pad. ? Place a towel between your skin and the heat source. ? Leave the heat on for 20-30 minutes. ? Remove the heat if your skin turns bright red. This is especially important if you are unable to feel pain, heat, or cold. You may have a greater risk of getting burned. General instructions  Rest as needed. Ask your health care provider what activities are safe for you.  Return to your normal activities as told by your health care provider.  Exercise as directed by your health care provider or physical therapist.  Do not use any products that contain nicotine or tobacco, such as cigarettes and e-cigarettes. These can delay bone healing. If you need help quitting, ask your health care provider.  Keep all follow-up visits as told by your health care provider. This is important. Contact a health care provider if:  Your pain is not controlled with medicine.  You have a fever.  Your pain is getting worse. Get help right away if:  You have weakness, numbness, or tingling in your legs or feet.  You lose control of your bladder or bowel. Summary  Sacroiliac joint dysfunction is a condition that causes inflammation on one or both sides of the sacroiliac (SI) joint.  This condition causes deep aching or burning pain in the low back. In some cases, the pain may also spread into one or both buttocks, hips, or thighs.  Treatment depends on the cause and severity of your condition.  It may include medicines to reduce pain and swelling or to relax muscles. This information is not intended to replace advice given to you by your health care provider. Make sure you discuss any questions you have with your health care provider. Document Revised: 05/16/2018 Document Reviewed: 10/30/2017 Elsevier Patient Education  2020 [employer].   Journal for Nurse Practitioners, 15(4), [phone]. Retrieved July 09, 2018 from http://clinicalkey.com/nursing">  Knee Exercises Ask your health care provider which exercises are safe for you. Do exercises exactly as told by your health care provider and adjust them as directed. It is normal to feel mild stretching, pulling, tightness, or discomfort as you do these exercises. Stop right away if you feel sudden pain or your pain gets worse. Do not begin these exercises until told by your health care provider. Stretching and range-of-motion exercises These exercises warm up your muscles and joints and improve the movement and flexibility of your knee. These exercises also help to relieve pain and swelling. Knee extension, prone  1. Lie on your abdomen (prone position) on a bed. 2. Place your left / right knee just beyond the edge of the surface so your knee is not on the bed. You can put a towel under your left / right thigh just above your kneecap for comfort. 3. Relax your leg muscles and allow gravity to straighten your knee (extension). You should feel a stretch behind your left / right knee. 4. Hold this position for __________ seconds. 5. Scoot up so your knee is supported between repetitions. Repeat __________ times. Complete this exercise __________ times a day. Knee flexion, active  1. Lie on your back with both legs straight. If this causes back discomfort, bend your left / right knee so your foot is flat on the floor. 2. Slowly slide your left / right heel back toward your buttocks. Stop when you feel a gentle stretch in the front of your  knee or thigh (flexion). 3. Hold this position for __________ seconds. 4. Slowly slide your left / right heel back to the starting position. Repeat __________ times. Complete this exercise __________ times a day. Quadriceps stretch, prone  1. Lie on your abdomen on a firm surface, such as a bed or padded floor. 2. Bend your left / right knee and hold your ankle. If you cannot reach your ankle or pant leg, loop a belt around your foot and grab the belt instead. 3. Gently pull your heel toward your buttocks. Your knee should not slide out to the side. You should feel a stretch in the front of your thigh and knee (quadriceps). 4. Hold this position for __________ seconds. Repeat __________ times. Complete this exercise __________ times a day. Hamstring, supine 1. Lie on your back (supine position). 2. Loop a belt or towel over the ball of your left / right foot. The ball of your foot is on the walking surface, right under your toes. 3. Straighten your left / right knee and slowly pull on the belt to raise your leg until you feel a gentle stretch behind your knee (hamstring). ? Do not let your knee bend while you do this. ? Keep your other leg flat on the floor. 4. Hold this position for __________ seconds. Repeat __________ times. Complete this exercise __________ times a day. Strengthening exercises These exercises build strength and endurance in your knee. Endurance is the ability to use your muscles for a long time, even after they get tired. Quadriceps, isometric This exercise stretches the muscles in front of your thigh (quadriceps) without moving your knee joint (isometric). 1. Lie on your back with your left / right leg extended and your other knee bent. Put a rolled towel or small pillow under your knee if told by your health care provider. 2. Slowly tense the muscles in the front of your left / right thigh. You should see your kneecap slide up toward your hip or see increased dimpling  just above the knee. This motion will push the back of the knee toward the floor. 3. For __________ seconds, hold the muscle as tight as you can without increasing your pain. 4. Relax the muscles slowly and completely. Repeat __________ times. Complete this exercise __________ times a day. Straight leg raises This exercise stretches the muscles in front of your thigh (quadriceps) and the muscles that move your hips (hip flexors). 1. Lie on your back with your left / right leg extended and your other knee bent. 2. Tense the muscles in the front of your  left / right thigh. You should see your kneecap slide up or see increased dimpling just above the knee. Your thigh may even shake a bit. 3. Keep these muscles tight as you raise your leg 4-6 inches (10-15 cm) off the floor. Do not let your knee bend. 4. Hold this position for __________ seconds. 5. Keep these muscles tense as you lower your leg. 6. Relax your muscles slowly and completely after each repetition. Repeat __________ times. Complete this exercise __________ times a day. Hamstring, isometric 1. Lie on your back on a firm surface. 2. Bend your left / right knee about __________ degrees. 3. Dig your left / right heel into the surface as if you are trying to pull it toward your buttocks. Tighten the muscles in the back of your thighs (hamstring) to "dig" as hard as you can without increasing any pain. 4. Hold this position for __________ seconds. 5. Release the tension gradually and allow your muscles to relax completely for __________ seconds after each repetition. Repeat __________ times. Complete this exercise __________ times a day. Hamstring curls If told by your health care provider, do this exercise while wearing ankle weights. Begin with __________ lb weights. Then increase the weight by 1 lb (0.5 kg) increments. Do not wear ankle weights that are more than __________ lb. 1. Lie on your abdomen with your legs straight. 2. Bend  your left / right knee as far as you can without feeling pain. Keep your hips flat against the floor. 3. Hold this position for __________ seconds. 4. Slowly lower your leg to the starting position. Repeat __________ times. Complete this exercise __________ times a day. Squats This exercise strengthens the muscles in front of your thigh and knee (quadriceps). 1. Stand in front of a table, with your feet and knees pointing straight ahead. You may rest your hands on the table for balance but not for support. 2. Slowly bend your knees and lower your hips like you are going to sit in a chair. ? Keep your weight over your heels, not over your toes. ? Keep your lower legs upright so they are parallel with the table legs. ? Do not let your hips go lower than your knees. ? Do not bend lower than told by your health care provider. ? If your knee pain increases, do not bend as low. 3. Hold the squat position for __________ seconds. 4. Slowly push with your legs to return to standing. Do not use your hands to pull yourself to standing. Repeat __________ times. Complete this exercise __________ times a day. Wall slides This exercise strengthens the muscles in front of your thigh and knee (quadriceps). 1. Lean your back against a smooth wall or door, and walk your feet out 18-24 inches (46-61 cm) from it. 2. Place your feet hip-width apart. 3. Slowly slide down the wall or door until your knees bend __________ degrees. Keep your knees over your heels, not over your toes. Keep your knees in line with your hips. 4. Hold this position for __________ seconds. Repeat __________ times. Complete this exercise __________ times a day. Straight leg raises This exercise strengthens the muscles that rotate the leg at the hip and move it away from your body (hip abductors). 1. Lie on your side with your left / right leg in the top position. Lie so your head, shoulder, knee, and hip line up. You may bend your bottom  knee to help you keep your balance. 2. Roll your hips slightly  forward so your hips are stacked directly over each other and your left / right knee is facing forward. 3. Leading with your heel, lift your top leg 4-6 inches (10-15 cm). You should feel the muscles in your outer hip lifting. ? Do not let your foot drift forward. ? Do not let your knee roll toward the ceiling. 4. Hold this position for __________ seconds. 5. Slowly return your leg to the starting position. 6. Let your muscles relax completely after each repetition. Repeat __________ times. Complete this exercise __________ times a day. Straight leg raises This exercise stretches the muscles that move your hips away from the front of the pelvis (hip extensors). 1. Lie on your abdomen on a firm surface. You can put a pillow under your hips if that is more comfortable. 2. Tense the muscles in your buttocks and lift your left / right leg about 4-6 inches (10-15 cm). Keep your knee straight as you lift your leg. 3. Hold this position for __________ seconds. 4. Slowly lower your leg to the starting position. 5. Let your leg relax completely after each repetition. Repeat __________ times. Complete this exercise __________ times a day. This information is not intended to replace advice given to you by your health care provider. Make sure you discuss any questions you have with your health care provider. Document Revised: 07/10/2018 Document Reviewed: 07/10/2018 Elsevier Patient Education  2020 [employer]   Hand Exercises Hand exercises can be helpful for almost anyone. These exercises can strengthen the hands, improve flexibility and movement, and increase blood flow to the hands. These results can make work and daily tasks easier. Hand exercises can be especially helpful for people who have joint pain from arthritis or have nerve damage from overuse (carpal tunnel syndrome). These exercises can also help people who have injured a  hand. Exercises Most of these hand exercises are gentle stretching and motion exercises. It is usually safe to do them often throughout the day. Warming up your hands before exercise may help to reduce stiffness. You can do this with gentle massage or by placing your hands in warm water for 10-15 minutes. It is normal to feel some stretching, pulling, tightness, or mild discomfort as you begin new exercises. This will gradually improve. Stop an exercise right away if you feel sudden, severe pain or your pain gets worse. Ask your health care provider which exercises are best for you. Knuckle bend or "claw" fist 1. Stand or sit with your arm, hand, and all five fingers pointed straight up. Make sure to keep your wrist straight during the exercise. 2. Gently bend your fingers down toward your palm until the tips of your fingers are touching the top of your palm. Keep your big knuckle straight and just bend the small knuckles in your fingers. 3. Hold this position for __________ seconds. 4. Straighten (extend) your fingers back to the starting position. Repeat this exercise 5-10 times with each hand. Full finger fist 1. Stand or sit with your arm, hand, and all five fingers pointed straight up. Make sure to keep your wrist straight during the exercise. 2. Gently bend your fingers into your palm until the tips of your fingers are touching the middle of your palm. 3. Hold this position for __________ seconds. 4. Extend your fingers back to the starting position, stretching every joint fully. Repeat this exercise 5-10 times with each hand. Straight fist 1. Stand or sit with your arm, hand, and all five fingers pointed straight  up. Make sure to keep your wrist straight during the exercise. 2. Gently bend your fingers at the big knuckle, where your fingers meet your hand, and the middle knuckle. Keep the knuckle at the tips of your fingers straight and try to touch the bottom of your palm. 3. Hold this  position for __________ seconds. 4. Extend your fingers back to the starting position, stretching every joint fully. Repeat this exercise 5-10 times with each hand. Tabletop 1. Stand or sit with your arm, hand, and all five fingers pointed straight up. Make sure to keep your wrist straight during the exercise. 2. Gently bend your fingers at the big knuckle, where your fingers meet your hand, as far down as you can while keeping the small knuckles in your fingers straight. Think of forming a tabletop with your fingers. 3. Hold this position for __________ seconds. 4. Extend your fingers back to the starting position, stretching every joint fully. Repeat this exercise 5-10 times with each hand. Finger spread 1. Place your hand flat on a table with your palm facing down. Make sure your wrist stays straight as you do this exercise. 2. Spread your fingers and thumb apart from each other as far as you can until you feel a gentle stretch. Hold this position for __________ seconds. 3. Bring your fingers and thumb tight together again. Hold this position for __________ seconds. Repeat this exercise 5-10 times with each hand. Making circles 1. Stand or sit with your arm, hand, and all five fingers pointed straight up. Make sure to keep your wrist straight during the exercise. 2. Make a circle by touching the tip of your thumb to the tip of your index finger. 3. Hold for __________ seconds. Then open your hand wide. 4. Repeat this motion with your thumb and each finger on your hand. Repeat this exercise 5-10 times with each hand. Thumb motion 1. Sit with your forearm resting on a table and your wrist straight. Your thumb should be facing up toward the ceiling. Keep your fingers relaxed as you move your thumb. 2. Lift your thumb up as high as you can toward the ceiling. Hold for __________ seconds. 3. Bend your thumb across your palm as far as you can, reaching the tip of your thumb for the small finger  (pinkie) side of your palm. Hold for __________ seconds. Repeat this exercise 5-10 times with each hand. Grip strengthening  1. Hold a stress ball or other soft ball in the middle of your hand. 2. Slowly increase the pressure, squeezing the ball as much as you can without causing pain. Think of bringing the tips of your fingers into the middle of your palm. All of your finger joints should bend when doing this exercise. 3. Hold your squeeze for __________ seconds, then relax. Repeat this exercise 5-10 times with each hand. Contact a health care provider if:  Your hand pain or discomfort gets much worse when you do an exercise.  Your hand pain or discomfort does not improve within 2 hours after you exercise. If you have any of these problems, stop doing these exercises right away. Do not do them again unless your health care provider says that you can. Get help right away if:  You develop sudden, severe hand pain or swelling. If this happens, stop doing these exercises right away. Do not do them again unless your health care provider says that you can. This information is not intended to replace advice given to you by your  health care provider. Make sure you discuss any questions you have with your health care provider. Document Revised: 01/10/2019 Document Reviewed: 09/20/2018 Elsevier Patient Education  2020 [employer].

## 2021-01-08 NOTE — Progress Notes (Deleted)
Office Visit Note  Patient: [name]             Date of Birth: 06-10-1929           MRN: [id number]             PCP: Vernona Rieger, MD Referring: Vernona Rieger, MD Visit Date: 01/19/2021 Occupation: @GUAROCC @  Subjective:  No chief complaint on file.   History of Present Illness: [name] is a 87 y.o. female ***   Activities of Daily Living:  Patient reports morning stiffness for *** {minute/hour:19697}.   Patient {ACTIONS;DENIES/REPORTS:21021675::"Denies"} nocturnal pain.  Difficulty dressing/grooming: {ACTIONS;DENIES/REPORTS:21021675::"Denies"} Difficulty climbing stairs: {ACTIONS;DENIES/REPORTS:21021675::"Denies"} Difficulty getting out of chair: {ACTIONS;DENIES/REPORTS:21021675::"Denies"} Difficulty using hands for taps, buttons, cutlery, and/or writing: {ACTIONS;DENIES/REPORTS:21021675::"Denies"}  No Rheumatology ROS completed.   PMFS History:  Patient Active Problem List   Diagnosis Date Noted  . Chronic SI joint pain 12/21/2017  . Postural kyphosis of thoracic region 12/21/2017  . Food allergy 08/03/2017  . Diarrhea 08/03/2017  . Weight loss 08/03/2017  . Mild persistent asthma 08/03/2017  . Primary osteoarthritis of both hands 02/17/2017  . DDD (degenerative disc disease), cervical 02/17/2017  . Age-related osteoporosis without current pathological fracture 02/17/2017  . Cervical adenopathy 07/24/2015  . Rosacea blepharoconjunctivitis 07/25/2013  . Musculoskeletal pain 07/25/2013  . Seasonal and perennial allergic rhinitis 07/26/2012  . Rhinitis 07/18/2011  . HYPERTENSION 11/25/2007  . COPD mixed type (HCC) 11/25/2007  . ASTHMA 11/19/2007    Past Medical History:  Diagnosis Date  . Asthma   . COPD (chronic obstructive pulmonary disease) (HCC)   . Diverticulosis   . Hypertension   . IBS (irritable bowel syndrome)   . Rosacea     Family History  Problem Relation Age of Onset  . Asthma Other   . Arthritis Other   .  Cancer Other    Past Surgical History:  Procedure Laterality Date  . ADENOIDECTOMY    . APPENDECTOMY    . CHOLECYSTECTOMY    . ORIF ACETABULAR FRACTURE     right leg  . STAPEDECTOMY    . TONSILLECTOMY     Social History   Social History Narrative  . Not on file   Immunization History  Administered Date(s) Administered  . Fluad Quad(high Dose 65+) 07/11/2017, 07/13/2018, 07/23/2020  . Influenza Split 07/18/2011, 07/17/2012  . Influenza Whole 07/20/2009, 07/19/2010  . Influenza, High Dose Seasonal PF 07/10/2013, 07/12/2019  . Influenza, Seasonal, Injecte, Preservative Fre 07/10/2014, 07/23/2015  . Influenza,inj,Quad PF,6+ Mos 07/10/2014, 07/23/2015  . PFIZER(Purple Top)SARS-COV-2 Vaccination 10/11/2019, 11/01/2019, 07/04/2020  . Pneumococcal Conjugate-13 08/01/2014, 08/22/2014  . Pneumococcal Polysaccharide-23 10/03/1997, 08/06/2003  . Zoster 03/03/2009  . Zoster Recombinat (Shingrix) 08/06/2018     Objective: Vital Signs: There were no vitals taken for this visit.   Physical Exam   Musculoskeletal Exam: ***  CDAI Exam: CDAI Score: -- Patient Global: --; Provider Global: -- Swollen: --; Tender: -- Joint Exam 01/19/2021   No joint exam has been documented for this visit   There is currently no information documented on the homunculus. Go to the Rheumatology activity and complete the homunculus joint exam.  Investigation: No additional findings.  Imaging: No results found.  Recent Labs: Lab Results  Component Value Date   WBC 7.1 08/03/2017   HGB 12.9 08/03/2017   PLT 405 (H) 08/03/2017   NA 136 08/03/2017   K 4.5 08/03/2017   CL 97 08/03/2017   CO2 26 08/03/2017   GLUCOSE 98 08/03/2017   BUN  15 08/03/2017   CREATININE 0.59 08/03/2017   BILITOT 0.3 08/03/2017   ALKPHOS 85 08/03/2017   AST 21 08/03/2017   ALT 9 08/03/2017   PROT 7.3 08/03/2017   ALBUMIN 4.4 08/03/2017   CALCIUM 9.8 08/03/2017   GFRAA 96 08/03/2017    Speciality Comments: No  specialty comments available.  Procedures:  No procedures performed Allergies: Clindamycin, Iodinated diagnostic agents, Chocolate, Eggs or egg-derived products, Fish allergy, Other, and Penicillins   Assessment / Plan:     Visit Diagnoses: No diagnosis found.  Orders: No orders of the defined types were placed in this encounter.  No orders of the defined types were placed in this encounter.   Face-to-face time spent with patient was *** minutes. Greater than 50% of time was spent in counseling and coordination of care.  Follow-Up Instructions: No follow-ups on file.   Ellen Henri, CMA  Note - This record has been created using Animal nutritionist.  Chart creation errors have been sought, but may not always  have been located. Such creation errors do not reflect on  the standard of medical care.

## 2021-01-19 ENCOUNTER — Ambulatory Visit: Payer: Medicare Other | Admitting: Rheumatology

## 2021-01-19 DIAGNOSIS — G8929 Other chronic pain: Secondary | ICD-10-CM

## 2021-01-19 DIAGNOSIS — M810 Age-related osteoporosis without current pathological fracture: Secondary | ICD-10-CM

## 2021-01-19 DIAGNOSIS — M5030 Other cervical disc degeneration, unspecified cervical region: Secondary | ICD-10-CM

## 2021-01-19 DIAGNOSIS — Z8679 Personal history of other diseases of the circulatory system: Secondary | ICD-10-CM

## 2021-01-19 DIAGNOSIS — M4004 Postural kyphosis, thoracic region: Secondary | ICD-10-CM

## 2021-01-19 DIAGNOSIS — Z8709 Personal history of other diseases of the respiratory system: Secondary | ICD-10-CM

## 2021-01-19 DIAGNOSIS — M1711 Unilateral primary osteoarthritis, right knee: Secondary | ICD-10-CM

## 2021-01-19 DIAGNOSIS — M19041 Primary osteoarthritis, right hand: Secondary | ICD-10-CM

## 2021-02-06 NOTE — Progress Notes (Signed)
Office Visit Note  Patient: [name]             Date of Birth: 28-Feb-1929           MRN: [id number]             PCP: Vernona Rieger, MD Referring: Vernona Rieger, MD Visit Date: 02/08/2021 Occupation: @GUAROCC @  Subjective:  Bilateral SI joint pain.   History of Present Illness: [name] is a 87 y.o. female with a history of osteoarthritis and degenerative disc disease.  She was accompanied by her daughter-in-law 89.  She states that she has been having pain and discomfort in the bilateral SI joints and requested cortisone injection.  She states she gets good relief from the cortisone injection which lasts for about 3 months and then the symptoms recur.  None of the other joints are painful.  Activities of Daily Living:  Patient reports morning stiffness for a few minutes.   Patient Denies nocturnal pain.  Difficulty dressing/grooming: Denies Difficulty climbing stairs: Reports Difficulty getting out of chair: Reports Difficulty using hands for taps, buttons, cutlery, and/or writing: Reports  Review of Systems  Constitutional: Positive for fatigue.  HENT: Positive for hearing loss. Negative for mouth sores, mouth dryness and nose dryness.   Eyes: Positive for pain and itching. Negative for dryness.  Respiratory: Positive for shortness of breath. Negative for difficulty breathing.   Cardiovascular: Positive for swelling in legs/feet. Negative for chest pain and palpitations.  Gastrointestinal: Positive for diarrhea. Negative for blood in stool and constipation.  Endocrine: Negative for increased urination.  Genitourinary: Negative for difficulty urinating.  Musculoskeletal: Positive for arthralgias, joint pain and morning stiffness. Negative for joint swelling, myalgias, muscle tenderness and myalgias.  Skin: Negative for color change, rash and redness.  Allergic/Immunologic: Negative for susceptible to infections.  Neurological: Negative for  dizziness, numbness, headaches, memory loss and weakness.  Hematological: Negative for bruising/bleeding tendency.  Psychiatric/Behavioral: Negative for confusion.    PMFS History:  Patient Active Problem List   Diagnosis Date Noted  . Chronic SI joint pain 12/21/2017  . Postural kyphosis of thoracic region 12/21/2017  . Food allergy 08/03/2017  . Diarrhea 08/03/2017  . Weight loss 08/03/2017  . Mild persistent asthma 08/03/2017  . Primary osteoarthritis of both hands 02/17/2017  . DDD (degenerative disc disease), cervical 02/17/2017  . Age-related osteoporosis without current pathological fracture 02/17/2017  . Cervical adenopathy 07/24/2015  . Rosacea blepharoconjunctivitis 07/25/2013  . Musculoskeletal pain 07/25/2013  . Seasonal and perennial allergic rhinitis 07/26/2012  . Rhinitis 07/18/2011  . HYPERTENSION 11/25/2007  . COPD mixed type (HCC) 11/25/2007  . ASTHMA 11/19/2007    Past Medical History:  Diagnosis Date  . Asthma   . COPD (chronic obstructive pulmonary disease) (HCC)   . Diverticulosis   . Hypertension   . IBS (irritable bowel syndrome)   . Rosacea     Family History  Problem Relation Age of Onset  . Asthma Other   . Arthritis Other   . Cancer Other    Past Surgical History:  Procedure Laterality Date  . ADENOIDECTOMY    . APPENDECTOMY    . CHOLECYSTECTOMY    . ORIF ACETABULAR FRACTURE     right leg  . STAPEDECTOMY    . TONSILLECTOMY     Social History   Social History Narrative  . Not on file   Immunization History  Administered Date(s) Administered  . Fluad Quad(high Dose 65+) 07/11/2017, 07/13/2018, 07/23/2020  . Influenza Split  07/18/2011, 07/17/2012  . Influenza Whole 07/20/2009, 07/19/2010  . Influenza, High Dose Seasonal PF 07/10/2013, 07/12/2019  . Influenza, Seasonal, Injecte, Preservative Fre 07/10/2014, 07/23/2015  . Influenza,inj,Quad PF,6+ Mos 07/10/2014, 07/23/2015  . PFIZER(Purple Top)SARS-COV-2 Vaccination 10/11/2019,  11/01/2019, 07/04/2020, 01/21/2021  . Pneumococcal Conjugate-13 08/01/2014, 08/22/2014  . Pneumococcal Polysaccharide-23 10/03/1997, 08/06/2003  . Zoster 03/03/2009  . Zoster Recombinat (Shingrix) 08/06/2018     Objective: Vital Signs: BP (!) 164/80 (BP Location: Left Arm, Patient Position: Sitting, Cuff Size: Normal)   Pulse 67   Resp 14   Ht 5\' 4"  (1.626 m)   Wt 120 lb (54.4 kg)   BMI 20.60 kg/m    Physical Exam Vitals and nursing note reviewed.  Constitutional:      Appearance: She is well-developed.  HENT:     Head: Normocephalic and atraumatic.  Eyes:     Conjunctiva/sclera: Conjunctivae normal.  Cardiovascular:     Rate and Rhythm: Normal rate and regular rhythm.     Heart sounds: Normal heart sounds.  Pulmonary:     Effort: Pulmonary effort is normal.     Breath sounds: Normal breath sounds.  Abdominal:     General: Bowel sounds are normal.     Palpations: Abdomen is soft.  Musculoskeletal:     Cervical back: Normal range of motion.  Lymphadenopathy:     Cervical: No cervical adenopathy.  Skin:    General: Skin is warm and dry.     Capillary Refill: Capillary refill takes less than 2 seconds.  Neurological:     Mental Status: She is alert and oriented to person, place, and time.  Psychiatric:        Behavior: Behavior normal.      Musculoskeletal Exam: She has limited range of motion of her cervical spine.  She has thoracic kyphosis.  She had tenderness on palpation over bilateral SI joints more prominent on the left side.  Shoulder joints, elbow joints, wrist joints with good range of motion.  She has CMC, PIP and DIP thickening consistent with osteoarthritis.  Knee joints with good range of motion.  She had bilateral pedal edema.  CDAI Exam: CDAI Score: -- Patient Global: --; Provider Global: -- Swollen: --; Tender: -- Joint Exam 02/08/2021   No joint exam has been documented for this visit   There is currently no information documented on the  homunculus. Go to the Rheumatology activity and complete the homunculus joint exam.  Investigation: No additional findings.  Imaging: No results found.  Recent Labs: Lab Results  Component Value Date   WBC 7.1 08/03/2017   HGB 12.9 08/03/2017   PLT 405 (H) 08/03/2017   NA 136 08/03/2017   K 4.5 08/03/2017   CL 97 08/03/2017   CO2 26 08/03/2017   GLUCOSE 98 08/03/2017   BUN 15 08/03/2017   CREATININE 0.59 08/03/2017   BILITOT 0.3 08/03/2017   ALKPHOS 85 08/03/2017   AST 21 08/03/2017   ALT 9 08/03/2017   PROT 7.3 08/03/2017   ALBUMIN 4.4 08/03/2017   CALCIUM 9.8 08/03/2017   GFRAA 96 08/03/2017    Speciality Comments: No specialty comments available.  Procedures:  Sacroiliac Joint Inj on 02/08/2021 12:37 PM Indications: pain Details: 27 G 1.5 in needle, posterior approach Medications: 1 mL lidocaine 1 %; 40 mg triamcinolone acetonide 40 MG/ML Aspirate: 0 mL Outcome: tolerated well, no immediate complications Procedure, treatment alternatives, risks and benefits explained, specific risks discussed. Consent was given by the patient. Immediately prior to procedure a time  out was called to verify the correct patient, procedure, equipment, support staff and site/side marked as required. Patient was prepped and draped in the usual sterile fashion.     Allergies: Clindamycin, Iodinated diagnostic agents, Chocolate, Eggs or egg-derived products, Fish allergy, Other, and Penicillins   Assessment / Plan:     Visit Diagnoses: Primary osteoarthritis of both hands-joint protection muscle strengthening was discussed.  Chronic SI joint pain-she has been having increased pain in her bilateral SI joints although the left SI joint is more painful.  As her blood pressure was elevated I decided to proceed with only left SI joint injection.  After informed consent was obtained the left SI joint was injected with cortisone as described above.  Have advised her to monitor blood pressure  closely.  Primary osteoarthritis of right knee-she continues to have some discomfort in her knee joints but is manageable.  DDD (degenerative disc disease), cervical-she has limited range of motion.  Postural kyphosis of thoracic region-stretching exercises were emphasized.  Age-related osteoporosis without current pathological fracture-DEXA is followed by her PCP.  History of hypertension-blood pressure is elevated today.  Have advised her to monitor blood pressure closely.  History of COPD  History of asthma  Orders: No orders of the defined types were placed in this encounter.  No orders of the defined types were placed in this encounter.    Follow-Up Instructions: Return in about 4 months (around 06/11/2021) for Osteoarthritis.   Pollyann Savoy, MD  Note - This record has been created using Animal nutritionist.  Chart creation errors have been sought, but may not always  have been located. Such creation errors do not reflect on  the standard of medical care.

## 2021-02-08 ENCOUNTER — Ambulatory Visit (INDEPENDENT_AMBULATORY_CARE_PROVIDER_SITE_OTHER): Payer: Medicare Other | Admitting: Rheumatology

## 2021-02-08 ENCOUNTER — Encounter: Payer: Self-pay | Admitting: Rheumatology

## 2021-02-08 ENCOUNTER — Other Ambulatory Visit: Payer: Self-pay

## 2021-02-08 VITALS — BP 164/80 | HR 67 | Resp 14 | Ht 64.0 in | Wt 120.0 lb

## 2021-02-08 DIAGNOSIS — M5030 Other cervical disc degeneration, unspecified cervical region: Secondary | ICD-10-CM | POA: Diagnosis not present

## 2021-02-08 DIAGNOSIS — M19042 Primary osteoarthritis, left hand: Secondary | ICD-10-CM

## 2021-02-08 DIAGNOSIS — Z8679 Personal history of other diseases of the circulatory system: Secondary | ICD-10-CM

## 2021-02-08 DIAGNOSIS — M810 Age-related osteoporosis without current pathological fracture: Secondary | ICD-10-CM

## 2021-02-08 DIAGNOSIS — M1711 Unilateral primary osteoarthritis, right knee: Secondary | ICD-10-CM

## 2021-02-08 DIAGNOSIS — M19041 Primary osteoarthritis, right hand: Secondary | ICD-10-CM

## 2021-02-08 DIAGNOSIS — G8929 Other chronic pain: Secondary | ICD-10-CM

## 2021-02-08 DIAGNOSIS — M533 Sacrococcygeal disorders, not elsewhere classified: Secondary | ICD-10-CM | POA: Diagnosis not present

## 2021-02-08 DIAGNOSIS — M4004 Postural kyphosis, thoracic region: Secondary | ICD-10-CM

## 2021-02-08 DIAGNOSIS — Z8709 Personal history of other diseases of the respiratory system: Secondary | ICD-10-CM

## 2021-02-08 MED ORDER — LIDOCAINE HCL 1 % IJ SOLN
1.0000 mL | INTRAMUSCULAR | Status: AC | PRN
  Administered 2021-02-08: 1 mL

## 2021-02-08 MED ORDER — TRIAMCINOLONE ACETONIDE 40 MG/ML IJ SUSP
40.0000 mg | INTRAMUSCULAR | Status: AC | PRN
  Administered 2021-02-08: 40 mg via INTRA_ARTICULAR

## 2021-03-23 ENCOUNTER — Ambulatory Visit: Payer: Medicare Other | Admitting: Rheumatology

## 2021-06-02 NOTE — Progress Notes (Signed)
Office Visit Note  Patient: [name]             Date of Birth: 16-May-1929           MRN: [id number]             PCP: Vernona Rieger, MD Referring: Vernona Rieger, MD Visit Date: 06/16/2021 Occupation: @GUAROCC @  Subjective:  Other (Left hip pain- patient requested a left SI joint injection. )   History of Present Illness: SAN RUA is a 87 y.o. female with a history of osteoarthritis, degenerative disc disease and osteoporosis.  She was accompanied by her daughter-in-law today.  She states for the last few months she has been having pain and discomfort in her left SI joint.  She would like to have the SI joint injection again.  She had good response to SI joint injections in the past.  She also has some stiffness in her joints but no joint swelling.  According to her daughter-in-law she has been having short-term memory loss.  She was given Aricept but she is not tolerating the medication well.  She is scheduled to have a CT scan of her brain.  Activities of Daily Living:  Patient reports morning stiffness for a few minutes.   Patient Reports nocturnal pain.  Difficulty dressing/grooming: Denies Difficulty climbing stairs: Reports Difficulty getting out of chair: Reports Difficulty using hands for taps, buttons, cutlery, and/or writing: Denies  Review of Systems  Constitutional:  Positive for fatigue.  HENT:  Negative for mouth sores, mouth dryness and nose dryness.   Eyes:  Positive for dryness. Negative for pain and itching.  Respiratory:  Positive for shortness of breath. Negative for difficulty breathing.   Cardiovascular:  Negative for chest pain and palpitations.  Gastrointestinal:  Negative for blood in stool, constipation and diarrhea.  Endocrine: Negative for increased urination.  Genitourinary:  Negative for difficulty urinating.  Musculoskeletal:  Positive for joint pain, joint pain and morning stiffness. Negative for joint swelling,  myalgias, muscle tenderness and myalgias.  Skin:  Negative for color change, rash and redness.  Allergic/Immunologic: Negative for susceptible to infections.  Neurological:  Positive for memory loss. Negative for dizziness, numbness, headaches and weakness.  Hematological:  Negative for bruising/bleeding tendency.  Psychiatric/Behavioral:  Positive for confusion.    PMFS History:  Patient Active Problem List   Diagnosis Date Noted   Chronic SI joint pain 12/21/2017   Postural kyphosis of thoracic region 12/21/2017   Food allergy 08/03/2017   Diarrhea 08/03/2017   Weight loss 08/03/2017   Mild persistent asthma 08/03/2017   Primary osteoarthritis of both hands 02/17/2017   DDD (degenerative disc disease), cervical 02/17/2017   Age-related osteoporosis without current pathological fracture 02/17/2017   Cervical adenopathy 07/24/2015   Rosacea blepharoconjunctivitis 07/25/2013   Musculoskeletal pain 07/25/2013   Seasonal and perennial allergic rhinitis 07/26/2012   Rhinitis 07/18/2011   HYPERTENSION 11/25/2007   COPD mixed type (HCC) 11/25/2007   ASTHMA 11/19/2007    Past Medical History:  Diagnosis Date   Asthma    COPD (chronic obstructive pulmonary disease) (HCC)    Diverticulosis    Hypertension    IBS (irritable bowel syndrome)    Rosacea     Family History  Problem Relation Age of Onset   Asthma Other    Arthritis Other    Cancer Other    Past Surgical History:  Procedure Laterality Date   ADENOIDECTOMY     APPENDECTOMY     CHOLECYSTECTOMY  ORIF ACETABULAR FRACTURE     right leg   STAPEDECTOMY     TONSILLECTOMY     Social History   Social History Narrative   Not on file   Immunization History  Administered Date(s) Administered   Fluad Quad(high Dose 65+) 07/11/2017, 07/13/2018, 07/23/2020   Influenza Split 07/18/2011, 07/17/2012   Influenza Whole 07/20/2009, 07/19/2010   Influenza, High Dose Seasonal PF 07/10/2013, 07/12/2019   Influenza,  Seasonal, Injecte, Preservative Fre 07/10/2014, 07/23/2015   Influenza,inj,Quad PF,6+ Mos 07/10/2014, 07/23/2015   PFIZER(Purple Top)SARS-COV-2 Vaccination 10/11/2019, 11/01/2019, 07/04/2020, 01/21/2021   Pneumococcal Conjugate-13 08/01/2014, 08/22/2014   Pneumococcal Polysaccharide-23 10/03/1997, 08/06/2003   Zoster Recombinat (Shingrix) 08/06/2018   Zoster, Live 03/03/2009     Objective: Vital Signs: BP (!) 144/70 (BP Location: Left Arm, Patient Position: Sitting, Cuff Size: Normal)   Pulse 62   Ht 5\' 3"  (1.6 m)   Wt 116 lb 3.2 oz (52.7 kg)   BMI 20.58 kg/m    Physical Exam Vitals and nursing note reviewed.  Constitutional:      Appearance: She is well-developed.  HENT:     Head: Normocephalic and atraumatic.  Eyes:     Conjunctiva/sclera: Conjunctivae normal.  Cardiovascular:     Rate and Rhythm: Normal rate and regular rhythm.     Heart sounds: Normal heart sounds.  Pulmonary:     Effort: Pulmonary effort is normal.     Breath sounds: Normal breath sounds.  Abdominal:     General: Bowel sounds are normal.     Palpations: Abdomen is soft.  Musculoskeletal:     Cervical back: Normal range of motion.  Lymphadenopathy:     Cervical: No cervical adenopathy.  Skin:    General: Skin is warm and dry.     Capillary Refill: Capillary refill takes less than 2 seconds.  Neurological:     Mental Status: She is alert and oriented to person, place, and time.  Psychiatric:        Behavior: Behavior normal.     Musculoskeletal Exam: C-spine limited lateral rotation was noted.  She had thoracic kyphosis.  She had limited range of motion of her lumbar spine.  She had tenderness on palpation over the left SI joint.  Shoulder joints and elbow joints with good range of motion.  She had bilateral PIP and DIP thickening.  Hip joints and knee joints in good range of motion without any warmth swelling or effusion.  There was no tenderness over ankles or MTPs.  CDAI Exam: CDAI Score:  -- Patient Global: --; Provider Global: -- Swollen: --; Tender: -- Joint Exam 06/16/2021   No joint exam has been documented for this visit   There is currently no information documented on the homunculus. Go to the Rheumatology activity and complete the homunculus joint exam.  Investigation: No additional findings.  Imaging: No results found.  Recent Labs: Lab Results  Component Value Date   WBC 7.1 08/03/2017   HGB 12.9 08/03/2017   PLT 405 (H) 08/03/2017   NA 136 08/03/2017   K 4.5 08/03/2017   CL 97 08/03/2017   CO2 26 08/03/2017   GLUCOSE 98 08/03/2017   BUN 15 08/03/2017   CREATININE 0.59 08/03/2017   BILITOT 0.3 08/03/2017   ALKPHOS 85 08/03/2017   AST 21 08/03/2017   ALT 9 08/03/2017   PROT 7.3 08/03/2017   ALBUMIN 4.4 08/03/2017   CALCIUM 9.8 08/03/2017   GFRAA 96 08/03/2017    Speciality Comments: No specialty comments available.  Procedures:  Sacroiliac Joint Inj on 06/16/2021 11:55 AM Indications: pain Details: 27 G 1.5 in needle, posterior approach Medications: 1 mL lidocaine 1 %; 40 mg triamcinolone acetonide 40 MG/ML Outcome: tolerated well, no immediate complications Procedure, treatment alternatives, risks and benefits explained, specific risks discussed. Immediately prior to procedure a time out was called to verify the correct patient, procedure, equipment, support staff and site/side marked as required. Patient was prepped and draped in the usual sterile fashion.    Allergies: Clindamycin, Iodinated diagnostic agents, Chocolate, Eggs or egg-derived products, Fish allergy, Other, and Penicillins   Assessment / Plan:     Visit Diagnoses: Primary osteoarthritis of both hands-she continues to have some discomfort in her hands.  Joint protection muscle strengthening was discussed.  Chronic SI joint pain-she has been having pain and discomfort in her left SI joint to the point she is having difficulty walking.  Per her request left SI joint was  injected with cortisone as described above.  She tolerated the procedure well.  Postprocedure instructions were given.  Primary osteoarthritis of right knee-she denies any discomfort today.  DDD (degenerative disc disease), cervical-she has some limitation with lateral rotation but no discomfort.  Postural kyphosis of thoracic region  Age-related osteoporosis without current pathological fracture - DEXA is followed by her PCP.  I do not have DEXA results.  She is currently not on any medications.  She is on calcium and vitamin D.  History of COPD  History of hypertension-her systolic blood pressure was slightly elevated.  She was advised to monitor blood pressure closely.  History of asthma  Orders: No orders of the defined types were placed in this encounter.  No orders of the defined types were placed in this encounter.    Follow-Up Instructions: Return in about 4 months (around 10/16/2021) for Osteoarthritis.   Pollyann Savoy, MD  Note - This record has been created using Animal nutritionist.  Chart creation errors have been sought, but may not always  have been located. Such creation errors do not reflect on  the standard of medical care.

## 2021-06-16 ENCOUNTER — Other Ambulatory Visit: Payer: Self-pay

## 2021-06-16 ENCOUNTER — Ambulatory Visit (INDEPENDENT_AMBULATORY_CARE_PROVIDER_SITE_OTHER): Payer: Medicare Other | Admitting: Rheumatology

## 2021-06-16 ENCOUNTER — Encounter: Payer: Self-pay | Admitting: Rheumatology

## 2021-06-16 VITALS — BP 144/70 | HR 62 | Ht 63.0 in | Wt 116.2 lb

## 2021-06-16 DIAGNOSIS — Z8679 Personal history of other diseases of the circulatory system: Secondary | ICD-10-CM

## 2021-06-16 DIAGNOSIS — M810 Age-related osteoporosis without current pathological fracture: Secondary | ICD-10-CM

## 2021-06-16 DIAGNOSIS — Z8709 Personal history of other diseases of the respiratory system: Secondary | ICD-10-CM

## 2021-06-16 DIAGNOSIS — M25552 Pain in left hip: Secondary | ICD-10-CM | POA: Diagnosis not present

## 2021-06-16 DIAGNOSIS — G8929 Other chronic pain: Secondary | ICD-10-CM

## 2021-06-16 DIAGNOSIS — M19041 Primary osteoarthritis, right hand: Secondary | ICD-10-CM | POA: Diagnosis not present

## 2021-06-16 DIAGNOSIS — M19042 Primary osteoarthritis, left hand: Secondary | ICD-10-CM

## 2021-06-16 DIAGNOSIS — M533 Sacrococcygeal disorders, not elsewhere classified: Secondary | ICD-10-CM

## 2021-06-16 DIAGNOSIS — M1711 Unilateral primary osteoarthritis, right knee: Secondary | ICD-10-CM | POA: Diagnosis not present

## 2021-06-16 DIAGNOSIS — M4004 Postural kyphosis, thoracic region: Secondary | ICD-10-CM

## 2021-06-16 DIAGNOSIS — M5030 Other cervical disc degeneration, unspecified cervical region: Secondary | ICD-10-CM

## 2021-06-16 MED ORDER — LIDOCAINE HCL 1 % IJ SOLN
1.0000 mL | INTRAMUSCULAR | Status: AC | PRN
  Administered 2021-06-16: 1 mL

## 2021-06-16 MED ORDER — TRIAMCINOLONE ACETONIDE 40 MG/ML IJ SUSP
40.0000 mg | INTRAMUSCULAR | Status: AC | PRN
  Administered 2021-06-16: 40 mg via INTRA_ARTICULAR

## 2021-10-11 NOTE — Progress Notes (Deleted)
Office Visit Note  Patient: [name]             Date of Birth: 1929-01-06           MRN: [id number]             PCP: Alma Friendly, MD Referring: Alma Friendly, MD Visit Date: 10/21/2021 Occupation: @GUAROCC @  Subjective:  No chief complaint on file.   History of Present Illness: [name] is a 88 y.o. female ***   Activities of Daily Living:  Patient reports morning stiffness for *** {minute/hour:19697}.   Patient {ACTIONS;DENIES/REPORTS:21021675::"Denies"} nocturnal pain.  Difficulty dressing/grooming: {ACTIONS;DENIES/REPORTS:21021675::"Denies"} Difficulty climbing stairs: {ACTIONS;DENIES/REPORTS:21021675::"Denies"} Difficulty getting out of chair: {ACTIONS;DENIES/REPORTS:21021675::"Denies"} Difficulty using hands for taps, buttons, cutlery, and/or writing: {ACTIONS;DENIES/REPORTS:21021675::"Denies"}  No Rheumatology ROS completed.   PMFS History:  Patient Active Problem List   Diagnosis Date Noted   Chronic SI joint pain 12/21/2017   Postural kyphosis of thoracic region 12/21/2017   Food allergy 08/03/2017   Diarrhea 08/03/2017   Weight loss 08/03/2017   Mild persistent asthma 08/03/2017   Primary osteoarthritis of both hands 02/17/2017   DDD (degenerative disc disease), cervical 02/17/2017   Age-related osteoporosis without current pathological fracture 02/17/2017   Cervical adenopathy 07/24/2015   Rosacea blepharoconjunctivitis 07/25/2013   Musculoskeletal pain 07/25/2013   Seasonal and perennial allergic rhinitis 07/26/2012   Rhinitis 07/18/2011   HYPERTENSION 11/25/2007   COPD mixed type ([place]) 11/25/2007   ASTHMA 11/19/2007    Past Medical History:  Diagnosis Date   Asthma    COPD (chronic obstructive pulmonary disease) ([place])    Diverticulosis    Hypertension    IBS (irritable bowel syndrome)    Rosacea     Family History  Problem Relation Age of Onset   Asthma Other    Arthritis Other    Cancer Other    Past  Surgical History:  Procedure Laterality Date   ADENOIDECTOMY     APPENDECTOMY     CHOLECYSTECTOMY     ORIF ACETABULAR FRACTURE     right leg   STAPEDECTOMY     TONSILLECTOMY     Social History   Social History Narrative   Not on file   Immunization History  Administered Date(s) Administered   Fluad Quad(high Dose 65+) 07/11/2017, 07/13/2018, 07/23/2020   Influenza Split 07/18/2011, 07/17/2012   Influenza Whole 07/20/2009, 07/19/2010   Influenza, High Dose Seasonal PF 07/10/2013, 07/12/2019   Influenza, Seasonal, Injecte, Preservative Fre 07/10/2014, 07/23/2015   Influenza,inj,Quad PF,6+ Mos 07/10/2014, 07/23/2015   PFIZER(Purple Top)SARS-COV-2 Vaccination 10/11/2019, 11/01/2019, 07/04/2020, 01/21/2021   Pneumococcal Conjugate-13 08/01/2014, 08/22/2014   Pneumococcal Polysaccharide-23 10/03/1997, 08/06/2003   Zoster Recombinat (Shingrix) 08/06/2018   Zoster, Live 03/03/2009     Objective: Vital Signs: There were no vitals taken for this visit.   Physical Exam   Musculoskeletal Exam: ***  CDAI Exam: CDAI Score: -- Patient Global: --; Provider Global: -- Swollen: --; Tender: -- Joint Exam 10/21/2021   No joint exam has been documented for this visit   There is currently no information documented on the homunculus. Go to the Rheumatology activity and complete the homunculus joint exam.  Investigation: No additional findings.  Imaging: No results found.  Recent Labs: Lab Results  Component Value Date   WBC 7.1 08/03/2017   HGB 12.9 08/03/2017   PLT 405 (H) 08/03/2017   NA 136 08/03/2017   K 4.5 08/03/2017   CL 97 08/03/2017   CO2 26 08/03/2017   GLUCOSE 98 08/03/2017  BUN 15 08/03/2017   CREATININE 0.59 08/03/2017   BILITOT 0.3 08/03/2017   ALKPHOS 85 08/03/2017   AST 21 08/03/2017   ALT 9 08/03/2017   PROT 7.3 08/03/2017   ALBUMIN 4.4 08/03/2017   CALCIUM 9.8 08/03/2017   GFRAA 96 08/03/2017    Speciality Comments: No specialty comments  available.  Procedures:  No procedures performed Allergies: Clindamycin, Iodinated contrast media, Chocolate, Eggs or egg-derived products, Fish allergy, Other, and Penicillins   Assessment / Plan:     Visit Diagnoses: No diagnosis found.  Orders: No orders of the defined types were placed in this encounter.  No orders of the defined types were placed in this encounter.   Face-to-face time spent with patient was *** minutes. Greater than 50% of time was spent in counseling and coordination of care.  Follow-Up Instructions: No follow-ups on file.   Earnestine Mealing, CMA  Note - This record has been created using Editor, commissioning.  Chart creation errors have been sought, but may not always  have been located. Such creation errors do not reflect on  the standard of medical care.

## 2021-10-19 ENCOUNTER — Ambulatory Visit: Payer: Medicare Other | Admitting: Rheumatology

## 2021-10-21 ENCOUNTER — Ambulatory Visit: Payer: Medicare Other | Admitting: Rheumatology

## 2021-10-21 DIAGNOSIS — Z8709 Personal history of other diseases of the respiratory system: Secondary | ICD-10-CM

## 2021-10-21 DIAGNOSIS — G8929 Other chronic pain: Secondary | ICD-10-CM

## 2021-10-21 DIAGNOSIS — M5030 Other cervical disc degeneration, unspecified cervical region: Secondary | ICD-10-CM

## 2021-10-21 DIAGNOSIS — M810 Age-related osteoporosis without current pathological fracture: Secondary | ICD-10-CM

## 2021-10-21 DIAGNOSIS — M4004 Postural kyphosis, thoracic region: Secondary | ICD-10-CM

## 2021-10-21 DIAGNOSIS — M19042 Primary osteoarthritis, left hand: Secondary | ICD-10-CM

## 2021-10-21 DIAGNOSIS — M1711 Unilateral primary osteoarthritis, right knee: Secondary | ICD-10-CM

## 2021-10-21 DIAGNOSIS — Z8679 Personal history of other diseases of the circulatory system: Secondary | ICD-10-CM

## 2021-12-02 NOTE — Progress Notes (Deleted)
? ?Office Visit Note ? ?Patient: [name]             ?Date of Birth: 1929-03-29           ?MRN: [id number]             ?PCP: Alma Friendly, MD ?Referring: Alma Friendly, MD ?Visit Date: 12/16/2021 ?Occupation: @GUAROCC @ ? ?Subjective:  ?No chief complaint on file. ? ? ?History of Present Illness: [name] is a 88 y.o. female ***  ? ?Activities of Daily Living:  ?Patient reports morning stiffness for *** {minute/hour:19697}.   ?Patient {ACTIONS;DENIES/REPORTS:21021675::"Denies"} nocturnal pain.  ?Difficulty dressing/grooming: {ACTIONS;DENIES/REPORTS:21021675::"Denies"} ?Difficulty climbing stairs: {ACTIONS;DENIES/REPORTS:21021675::"Denies"} ?Difficulty getting out of chair: {ACTIONS;DENIES/REPORTS:21021675::"Denies"} ?Difficulty using hands for taps, buttons, cutlery, and/or writing: {ACTIONS;DENIES/REPORTS:21021675::"Denies"} ? ?No Rheumatology ROS completed.  ? ?PMFS History:  ?Patient Active Problem List  ? Diagnosis Date Noted  ?? Chronic SI joint pain 12/21/2017  ?? Postural kyphosis of thoracic region 12/21/2017  ?? Food allergy 08/03/2017  ?? Diarrhea 08/03/2017  ?? Weight loss 08/03/2017  ?? Mild persistent asthma 08/03/2017  ?? Primary osteoarthritis of both hands 02/17/2017  ?? DDD (degenerative disc disease), cervical 02/17/2017  ?? Age-related osteoporosis without current pathological fracture 02/17/2017  ?? Cervical adenopathy 07/24/2015  ?? Rosacea blepharoconjunctivitis 07/25/2013  ?? Musculoskeletal pain 07/25/2013  ?? Seasonal and perennial allergic rhinitis 07/26/2012  ?? Rhinitis 07/18/2011  ?? HYPERTENSION 11/25/2007  ?? COPD mixed type ([place]) 11/25/2007  ?? ASTHMA 11/19/2007  ?  ?Past Medical History:  ?Diagnosis Date  ?? Asthma   ?? COPD (chronic obstructive pulmonary disease) ([place])   ?? Diverticulosis   ?? Hypertension   ?? IBS (irritable bowel syndrome)   ?? Rosacea   ?  ?Family History  ?Problem Relation Age of Onset  ?? Asthma Other   ?? Arthritis Other   ??  Cancer Other   ? ?Past Surgical History:  ?Procedure Laterality Date  ?? ADENOIDECTOMY    ?? APPENDECTOMY    ?? CHOLECYSTECTOMY    ?? ORIF ACETABULAR FRACTURE    ? right leg  ?? STAPEDECTOMY    ?? TONSILLECTOMY    ? ?Social History  ? ?Social History Narrative  ?? Not on file  ? ?Immunization History  ?Administered Date(s) Administered  ?? Fluad Quad(high Dose 65+) 07/11/2017, 07/13/2018, 07/23/2020  ?? Influenza Split 07/18/2011, 07/17/2012  ?? Influenza Whole 07/20/2009, 07/19/2010  ?? Influenza, High Dose Seasonal PF 07/10/2013, 07/12/2019  ?? Influenza, Seasonal, Injecte, Preservative Fre 07/10/2014, 07/23/2015  ?? Influenza,inj,Quad PF,6+ Mos 07/10/2014, 07/23/2015  ?? PFIZER(Purple Top)SARS-COV-2 Vaccination 10/11/2019, 11/01/2019, 07/04/2020, 01/21/2021  ?? Pneumococcal Conjugate-13 08/01/2014, 08/22/2014  ?? Pneumococcal Polysaccharide-23 10/03/1997, 08/06/2003  ?? Zoster Recombinat (Shingrix) 08/06/2018  ?? Zoster, Live 03/03/2009  ?  ? ?Objective: ?Vital Signs: There were no vitals taken for this visit.  ? ?Physical Exam  ? ?Musculoskeletal Exam: *** ? ?CDAI Exam: ?CDAI Score: -- ?Patient Global: --; Provider Global: -- ?Swollen: --; Tender: -- ?Joint Exam 12/16/2021  ? ?No joint exam has been documented for this visit  ? ?There is currently no information documented on the homunculus. Go to the Rheumatology activity and complete the homunculus joint exam. ? ?Investigation: ?No additional findings. ? ?Imaging: ?No results found. ? ?Recent Labs: ?Lab Results  ?Component Value Date  ? WBC 7.1 08/03/2017  ? HGB 12.9 08/03/2017  ? PLT 405 (H) 08/03/2017  ? NA 136 08/03/2017  ? K 4.5 08/03/2017  ? CL 97 08/03/2017  ? CO2 26 08/03/2017  ? GLUCOSE 98 08/03/2017  ?  BUN 15 08/03/2017  ? CREATININE 0.59 08/03/2017  ? BILITOT 0.3 08/03/2017  ? ALKPHOS 85 08/03/2017  ? AST 21 08/03/2017  ? ALT 9 08/03/2017  ? PROT 7.3 08/03/2017  ? ALBUMIN 4.4 08/03/2017  ? CALCIUM 9.8 08/03/2017  ? GFRAA 96 08/03/2017   ? ? ?Speciality Comments: No specialty comments available. ? ?Procedures:  ?No procedures performed ?Allergies: Clindamycin, Iodinated contrast media, Chocolate, Eggs or egg-derived products, Fish allergy, Other, and Penicillins  ? ?Assessment / Plan:     ?Visit Diagnoses: No diagnosis found. ? ?Orders: ?No orders of the defined types were placed in this encounter. ? ?No orders of the defined types were placed in this encounter. ? ? ?Face-to-face time spent with patient was *** minutes. Greater than 50% of time was spent in counseling and coordination of care. ? ?Follow-Up Instructions: No follow-ups on file. ? ? ?Earnestine Mealing, CMA ? ?Note - This record has been created using [employer].  ?Chart creation errors have been sought, but may not always  ?have been located. Such creation errors do not reflect on  ?the standard of medical care.  ?

## 2021-12-16 ENCOUNTER — Ambulatory Visit: Payer: Medicare Other | Admitting: Rheumatology

## 2021-12-16 DIAGNOSIS — G8929 Other chronic pain: Secondary | ICD-10-CM

## 2021-12-16 DIAGNOSIS — M4004 Postural kyphosis, thoracic region: Secondary | ICD-10-CM

## 2021-12-16 DIAGNOSIS — M19041 Primary osteoarthritis, right hand: Secondary | ICD-10-CM

## 2021-12-16 DIAGNOSIS — Z8679 Personal history of other diseases of the circulatory system: Secondary | ICD-10-CM

## 2021-12-16 DIAGNOSIS — M1711 Unilateral primary osteoarthritis, right knee: Secondary | ICD-10-CM

## 2021-12-16 DIAGNOSIS — M5030 Other cervical disc degeneration, unspecified cervical region: Secondary | ICD-10-CM

## 2021-12-16 DIAGNOSIS — Z8709 Personal history of other diseases of the respiratory system: Secondary | ICD-10-CM

## 2021-12-16 DIAGNOSIS — M810 Age-related osteoporosis without current pathological fracture: Secondary | ICD-10-CM
